# Patient Record
Sex: Female | Born: 1985
Health system: Southern US, Community
[De-identification: ages and names within clinical notes are randomized; demographics above are authoritative.]

## PROBLEM LIST (undated history)

## (undated) ENCOUNTER — Inpatient Hospital Stay (HOSPITAL_COMMUNITY): Payer: Self-pay

## (undated) DIAGNOSIS — N390 Urinary tract infection, site not specified: Secondary | ICD-10-CM

## (undated) DIAGNOSIS — O139 Gestational [pregnancy-induced] hypertension without significant proteinuria, unspecified trimester: Secondary | ICD-10-CM

## (undated) DIAGNOSIS — I499 Cardiac arrhythmia, unspecified: Secondary | ICD-10-CM

## (undated) DIAGNOSIS — F419 Anxiety disorder, unspecified: Secondary | ICD-10-CM

## (undated) DIAGNOSIS — F32A Depression, unspecified: Secondary | ICD-10-CM

## (undated) DIAGNOSIS — F329 Major depressive disorder, single episode, unspecified: Secondary | ICD-10-CM

## (undated) DIAGNOSIS — Z8619 Personal history of other infectious and parasitic diseases: Secondary | ICD-10-CM

## (undated) HISTORY — DX: Anxiety disorder, unspecified: F41.9

## (undated) HISTORY — PX: TONSILLECTOMY: SUR1361

## (undated) HISTORY — DX: Cardiac arrhythmia, unspecified: I49.9

## (undated) HISTORY — DX: Personal history of other infectious and parasitic diseases: Z86.19

## (undated) HISTORY — DX: Urinary tract infection, site not specified: N39.0

---
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
## 2004-12-10 ENCOUNTER — Other Ambulatory Visit: Admission: RE | Admit: 2004-12-10 | Discharge: 2004-12-10 | Payer: Self-pay | Admitting: Obstetrics and Gynecology

## 2011-01-09 ENCOUNTER — Encounter: Payer: Self-pay | Admitting: Family Medicine

## 2011-01-09 ENCOUNTER — Ambulatory Visit (INDEPENDENT_AMBULATORY_CARE_PROVIDER_SITE_OTHER): Payer: PRIVATE HEALTH INSURANCE | Admitting: Family Medicine

## 2011-01-09 DIAGNOSIS — L309 Dermatitis, unspecified: Secondary | ICD-10-CM

## 2011-01-09 DIAGNOSIS — F411 Generalized anxiety disorder: Secondary | ICD-10-CM

## 2011-01-09 DIAGNOSIS — F419 Anxiety disorder, unspecified: Secondary | ICD-10-CM | POA: Insufficient documentation

## 2011-01-09 DIAGNOSIS — L259 Unspecified contact dermatitis, unspecified cause: Secondary | ICD-10-CM

## 2011-01-09 DIAGNOSIS — Z8249 Family history of ischemic heart disease and other diseases of the circulatory system: Secondary | ICD-10-CM

## 2011-01-09 LAB — BASIC METABOLIC PANEL
BUN: 16 mg/dL (ref 6–23)
Chloride: 105 mEq/L (ref 96–112)
Creatinine, Ser: 0.8 mg/dL (ref 0.4–1.2)
GFR: 95.13 mL/min (ref >60.00)
Potassium: 4.7 mEq/L (ref 3.5–5.1)

## 2011-01-09 LAB — CBC WITH DIFFERENTIAL/PLATELET
Basophils Absolute: 0 10*3/uL (ref 0.0–0.1)
Eosinophils Absolute: 0.1 10*3/uL (ref 0.0–0.7)
Lymphocytes Relative: 29 % (ref 12.0–46.0)
MCHC: 34.2 g/dL (ref 30.0–36.0)
MCV: 88.2 fl (ref 78.0–100.0)
Monocytes Absolute: 0.6 10*3/uL (ref 0.1–1.0)
Neutro Abs: 4.7 10*3/uL (ref 1.4–7.7)
Neutrophils Relative %: 62 % (ref 43.0–77.0)
RDW: 13 % (ref 11.5–14.6)

## 2011-01-09 LAB — LIPID PANEL
Cholesterol: 188 mg/dL (ref 0–200)
HDL: 70.6 mg/dL (ref >39.00)
LDL Cholesterol: 97 mg/dL (ref 0–99)
VLDL: 20.6 mg/dL (ref 0.0–40.0)

## 2011-01-09 LAB — HEPATIC FUNCTION PANEL
ALT: 13 U/L (ref 0–35)
Bilirubin, Direct: 0 mg/dL (ref 0.0–0.3)
Total Bilirubin: 0.4 mg/dL (ref 0.3–1.2)

## 2011-01-09 LAB — TSH: TSH: 1.71 u[IU]/mL (ref 0.35–5.50)

## 2011-01-09 MED ORDER — ALPRAZOLAM 0.5 MG PO TABS: 0.5000 mg | ORAL_TABLET | Freq: Three times a day (TID) | ORAL | Status: AC | PRN

## 2011-01-09 MED ORDER — DESOXIMETASONE 0.25 % EX OINT: 1.0000 "application " | TOPICAL_OINTMENT | Freq: Two times a day (BID) | CUTANEOUS | Status: AC

## 2011-01-09 MED ORDER — BUSPIRONE HCL 15 MG PO TABS: 15.0000 mg | ORAL_TABLET | Freq: Two times a day (BID) | ORAL | Status: AC

## 2011-01-09 NOTE — Progress Notes (Signed)
  Subjective:    Patient ID: Lynn Bell, female    DOB: [birth year], 25 y.o.   MRN: [id number]  HPI Here today to establish care.  No previous PCP.  GYN- [facility])  Family hx of CAD- mom had sudden cardiac death last year in her 59s, dad had MI 1 month later.  Pt is obviously concerned about her risk for CAD/MI/CVA.  Reports brother recently had labs checked and his lipids were 'a mess'.  + smoker.  Exercising regularly.  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  Hands scabbed, peeled, became infected.  Was seeing Emily Filbert and Danella Deis derm, started on steroid cream.  Took a year off of work, sxs improved but did not resolve.  Switched dye line and ingredient is no longer present but hands again are breaking out.  Painful, itchy.  Pt confused b/c she always wears gloves, not coming in direct contact w/ chemicals.  Still using steroid ointment- clobetasol.  sxs only on hands and wrists.  Anxiety/Depression- has been on Wellbutrin, Lexapro.  Will have 'panic moments'- will take mom or dad's Alprazolam w/ good results but feels she would need to take this frequently.   Review of Systems For ROS see HPI     Objective:   Physical Exam  Constitutional: She is oriented to person, place, and time. She appears well-developed and well-nourished. No distress.  HENT:  Head: Normocephalic and atraumatic.  Eyes: Conjunctivae and EOM are normal. Pupils are equal, round, and reactive to light.  Neck: Normal range of motion. Neck supple. No thyromegaly present.  Cardiovascular: Normal rate, regular rhythm, normal heart sounds and intact distal pulses.   No murmur heard. Pulmonary/Chest: Effort normal and breath sounds normal. No respiratory distress. She has no wheezes.  Abdominal: Soft. Bowel sounds are normal. She exhibits no distension. There is no tenderness. There is no rebound.  Musculoskeletal: She exhibits no edema.  Lymphadenopathy:   She has no cervical adenopathy.  Neurological: She is alert and oriented to person, place, and time. No cranial nerve deficit. Coordination normal.  Skin: Skin is warm and dry.       Skin on hands and wrists cracked, scabbed, peeling, some ? plaque formation  Psychiatric:       anxious          Assessment & Plan:

## 2011-01-09 NOTE — Patient Instructions (Signed)
Please schedule your complete physical in 1 month (we'll also review your anxiety at this time) We'll notify you of your lab results Start the Buspar- 1/2 tab twice daily x2 weeks and then increase to 1 tab twice daily Use the Alprazolam only as needed for those severe panic moments Increase the steroid ointment to twice daily Quit smoking!  You can do it! Call with any questions or concerns Welcome!  We're glad to have you!

## 2011-01-13 LAB — VITAMIN D 1,25 DIHYDROXY: Vitamin D3 1, 25 (OH)2: 31 pg/mL

## 2011-01-14 ENCOUNTER — Encounter: Payer: Self-pay | Admitting: *Deleted

## 2011-01-19 NOTE — Assessment & Plan Note (Signed)
Stressed the importance of smoking cessation, regular exercise, and healthy diet.  Will check labs to risk stratify.  If normal lipid panel will do advanced lipid testing in 6-12 months.  Pt expressed understanding and is in agreement w/ plan.

## 2011-01-19 NOTE — Assessment & Plan Note (Signed)
Pt w/ moderate to severe sxs on hands.  Has derm but hasn't seen them recently.  Needs steroid cream refill.  Will switch to ointment for better penetration.  Reviewed trigger avoidance.  Will follow.

## 2011-01-19 NOTE — Assessment & Plan Note (Signed)
Rather than having pt become dependent on frequent benzo use will start buspar and have benzos on hand prn.  Reviewed supportive care and red flags that should prompt return.  Pt expressed understanding and is in agreement w/ plan.

## 2011-02-10 ENCOUNTER — Ambulatory Visit: Payer: PRIVATE HEALTH INSURANCE | Admitting: Family Medicine

## 2011-02-11 ENCOUNTER — Ambulatory Visit (INDEPENDENT_AMBULATORY_CARE_PROVIDER_SITE_OTHER): Payer: PRIVATE HEALTH INSURANCE | Admitting: Family Medicine

## 2011-02-11 DIAGNOSIS — F411 Generalized anxiety disorder: Secondary | ICD-10-CM

## 2011-02-11 DIAGNOSIS — F419 Anxiety disorder, unspecified: Secondary | ICD-10-CM

## 2011-02-11 NOTE — Patient Instructions (Addendum)
You look great! Call me if you think we need to go up on the Buspar Call with any questions or concerns Have a great summer!

## 2011-02-11 NOTE — Progress Notes (Signed)
  Subjective:    Patient ID: Lynn Bell, female    DOB: Jan 06, 1986, 25 y.o.   MRN: [id number]  HPI  Anxiety- started buspar ~10 days ago.  Feels sxs are better controlled.  Will use xanax prn- only had 2 panicked moments since last visit.  Denies side effects  Review of Systems For ROS see HPI     Objective:   Physical Exam  Constitutional: She appears well-developed and well-nourished. No distress.  Psychiatric: She has a normal mood and affect. Her behavior is normal. Thought content normal.          Assessment & Plan:

## 2011-02-15 NOTE — Assessment & Plan Note (Signed)
Pt feels sxs are better.  Pt to call if she feels she needs to adjust or change her dose.  Will follow.

## 2012-07-06 ENCOUNTER — Emergency Department (HOSPITAL_COMMUNITY)
Admission: EM | Admit: 2012-07-06 | Discharge: 2012-07-06 | Disposition: A | Discharge status: Home / Self Care | Payer: PRIVATE HEALTH INSURANCE | Attending: Emergency Medicine | Admitting: Emergency Medicine

## 2012-07-06 ENCOUNTER — Telehealth: Payer: Self-pay

## 2012-07-06 ENCOUNTER — Telehealth: Payer: Self-pay | Admitting: Family Medicine

## 2012-07-06 ENCOUNTER — Encounter (HOSPITAL_COMMUNITY): Payer: Self-pay | Admitting: Emergency Medicine

## 2012-07-06 DIAGNOSIS — Z87440 Personal history of urinary (tract) infections: Secondary | ICD-10-CM | POA: Insufficient documentation

## 2012-07-06 DIAGNOSIS — Z8679 Personal history of other diseases of the circulatory system: Secondary | ICD-10-CM | POA: Insufficient documentation

## 2012-07-06 DIAGNOSIS — Z79899 Other long term (current) drug therapy: Secondary | ICD-10-CM | POA: Insufficient documentation

## 2012-07-06 DIAGNOSIS — R42 Dizziness and giddiness: Secondary | ICD-10-CM | POA: Insufficient documentation

## 2012-07-06 DIAGNOSIS — R1110 Vomiting, unspecified: Secondary | ICD-10-CM | POA: Insufficient documentation

## 2012-07-06 DIAGNOSIS — Z8619 Personal history of other infectious and parasitic diseases: Secondary | ICD-10-CM | POA: Insufficient documentation

## 2012-07-06 DIAGNOSIS — F17200 Nicotine dependence, unspecified, uncomplicated: Secondary | ICD-10-CM | POA: Insufficient documentation

## 2012-07-06 LAB — BASIC METABOLIC PANEL
BUN: 14 mg/dL (ref 6–23)
Calcium: 9.6 mg/dL (ref 8.4–10.5)
GFR calc non Af Amer: >90 mL/min (ref >90)
Glucose, Bld: 104 mg/dL — ABNORMAL HIGH (ref 70–99)

## 2012-07-06 LAB — URINALYSIS, ROUTINE W REFLEX MICROSCOPIC
Bilirubin Urine: NEGATIVE
Protein, ur: NEGATIVE mg/dL
Urobilinogen, UA: 0.2 mg/dL (ref 0.0–1.0)

## 2012-07-06 LAB — CBC WITH DIFFERENTIAL/PLATELET
Basophils Relative: 0 % (ref 0–1)
Eosinophils Absolute: 0.1 10*3/uL (ref 0.0–0.7)
Hemoglobin: 13.9 g/dL (ref 12.0–15.0)
MCH: 30.3 pg (ref 26.0–34.0)
MCHC: 34.8 g/dL (ref 30.0–36.0)
Monocytes Relative: 3 % (ref 3–12)
Neutrophils Relative %: 86 % — ABNORMAL HIGH (ref 43–77)
Platelets: 311 10*3/uL (ref 150–400)

## 2012-07-06 LAB — URINE MICROSCOPIC-ADD ON

## 2012-07-06 LAB — PREGNANCY, URINE: Preg Test, Ur: NEGATIVE

## 2012-07-06 MED ORDER — MECLIZINE HCL 25 MG PO TABS: 25.0000 mg | ORAL_TABLET | Freq: Three times a day (TID) | ORAL | Status: DC | PRN

## 2012-07-06 MED ORDER — SODIUM CHLORIDE 0.9 % IV BOLUS (SEPSIS)
1000.0000 mL | Freq: Once | INTRAVENOUS | Status: AC
  Administered 2012-07-06: 1000 mL via INTRAVENOUS

## 2012-07-06 MED ORDER — ONDANSETRON HCL 4 MG/2ML IJ SOLN
4.0000 mg | Freq: Once | INTRAMUSCULAR | Status: AC
  Administered 2012-07-06: 4 mg via INTRAVENOUS
  Filled 2012-07-06: qty 2

## 2012-07-06 MED ORDER — MECLIZINE HCL 25 MG PO TABS
25.0000 mg | ORAL_TABLET | Freq: Once | ORAL | Status: AC
  Administered 2012-07-06: 25 mg via ORAL
  Filled 2012-07-06: qty 1

## 2012-07-06 MED ORDER — KETOROLAC TROMETHAMINE 30 MG/ML IJ SOLN
INTRAMUSCULAR | Status: AC
  Administered 2012-07-06: 30 mg via INTRAVENOUS
  Filled 2012-07-06: qty 1

## 2012-07-06 NOTE — Telephone Encounter (Signed)
Caller nurse states mom of pt states pt vomiting with severe HA, dizzy, vertigo, needs to be seen immediately. I talked to PCP Tabori and she is behind and booked up this AM. Advised caller nurse of this and she stated she will send pt to ER.    MW

## 2012-07-06 NOTE — ED Provider Notes (Signed)
Medical screening examination/treatment/procedure(s) were performed by non-physician practitioner and as supervising physician I was immediately available for consultation/collaboration.   Carleene Cooper III, MD 07/06/12 Ernestina [place]

## 2012-07-06 NOTE — Telephone Encounter (Signed)
Caller: Julia/Mother; Patient Name: Lynn Bell; PCP: Sheliah Hatch.; Best Callback Phone Number: [phone]; Reason for call: Other Sudden onset of vomiiting since 5:30 am today 10/23 with headache, lying on bathroomm floor and has been vomiting profusely.  Complains of dizziness, vertigo.  Emesis yellow clear.  Has given fluids, cracker but all has come back up.  Pain Right ear into Right shoulder but can put chin to chest.  Afebrile.  LMP started 3-4 days ago.  Triaged in Vomiting, Headache Guideline - Disposition:  ER Immediately due to New Onset of severe dizziness and vertigo.  Spoke with Hilda Lias in office - unable to work patient into schedule this morning, send to [facility] ER.  Gave care information for transport.

## 2012-07-06 NOTE — ED Notes (Signed)
Pt c/o dizziness and vomiting starting this am upon waking; pt sts feels like room is spinning and having some generalized abd pain

## 2012-07-06 NOTE — Telephone Encounter (Signed)
Pt currently in [facility] 

## 2012-07-06 NOTE — ED Provider Notes (Signed)
History     CSN: [id number]  Arrival date & time 07/06/12  4010   First MD Initiated Contact with Patient 07/06/12 1037      Chief Complaint  Patient presents with  . Dizziness  . Emesis    (Consider location/radiation/quality/duration/timing/severity/associated sxs/prior treatment) HPI Patient presents emergency department with dizziness, and nausea with vomiting, that began when she awoke this morning.  Patient, states, that she awoke with room spinning type dizziness.  Patient, states, that she did not have chest pain, shortness of breath, abdominal pain, numbness, weakness, visual changes, syncope, or fever.  Patient said she did not take anything prior to arrival, for her symptoms.  Patient, states, that movement, and made her symptoms worse.The patient states that she has been feeling better at arrival here. Past Medical History  Diagnosis Date  . History of chicken pox   . UTI (lower urinary tract infection)   . Cardiac arrhythmia     History reviewed. No pertinent past surgical history.  Family History  Problem Relation Age of Onset  . Breast cancer Maternal Grandmother     possibly paternal also  . Hyperlipidemia Father   . Hyperlipidemia Mother   . Heart disease Father   . Heart disease Mother   . Hypertension Father   . Hypertension Mother   . Kidney disease Paternal Grandfather   . Cancer Maternal Grandfather     kidney removal due to mass    History  Substance Use Topics  . Smoking status: Current Every Day Smoker  . Smokeless tobacco: Not on file   Comment: 1 pack per week  . Alcohol Use: Yes     1 glass of wine per night    OB History    Grav Para Term Preterm Abortions TAB SAB Ect Mult Living                  Review of Systems All other systems negative except as documented in the HPI. All pertinent positives and negatives as reviewed in the HPI.  Allergies  Review of patient's allergies indicates no known allergies.  Home Medications    Current Outpatient Rx  Name Route Sig Dispense Refill  . ALPRAZOLAM 0.25 MG PO TABS Oral Take 0.25 mg by mouth 3 (three) times daily as needed. For anxiety    . ZZZQUIL PO Oral Take 1 tablet by mouth at bedtime as needed. For sleep    . ADULT MULTIVITAMIN W/MINERALS CH Oral Take 1 tablet by mouth daily.      BP 120/77  Pulse 98  Temp 98.3 F (36.8 C) (Oral)  Resp 18  SpO2 100%  Physical Exam  Nursing note and vitals reviewed. Constitutional: She is oriented to person, place, and time. She appears well-developed and well-nourished.  HENT:  Head: Normocephalic and atraumatic.  Mouth/Throat: Oropharynx is clear and moist. No oropharyngeal exudate.  Cardiovascular: Normal rate, regular rhythm and normal heart sounds.  Exam reveals no gallop and no friction rub.   No murmur heard. Pulmonary/Chest: Effort normal and breath sounds normal. No respiratory distress. She has no wheezes. She has no rales.  Neurological: She is alert and oriented to person, place, and time. She has normal strength. No cranial nerve deficit or sensory deficit. She exhibits normal muscle tone. Coordination and gait normal. GCS eye subscore is 4. GCS verbal subscore is 5. GCS motor subscore is 6.    ED Course  Procedures (including critical care time)  Labs Reviewed  URINALYSIS, ROUTINE W REFLEX  MICROSCOPIC - Abnormal; Notable for the following:    Hgb urine dipstick TRACE (*)     All other components within normal limits  BASIC METABOLIC PANEL - Abnormal; Notable for the following:    Glucose, Bld 104 (*)     All other components within normal limits  CBC WITH DIFFERENTIAL - Abnormal; Notable for the following:    WBC 15.6 (*)     Neutrophils Relative 86 (*)     Neutro Abs 13.5 (*)     Lymphocytes Relative 10 (*)     All other components within normal limits  POCT PREGNANCY, URINE  URINE MICROSCOPIC-ADD ON  PREGNANCY, URINE    The patient is feeling better at this time. The patient has received  fluids here as well as meclizine. The patient will be advised to return here as needed. The patient is asked to follow up with her PCP. The patient most likely has viral inner ear infection. The patient will be asked to increase her fluid intake.  MDM  MDM Reviewed: vitals and nursing note Interpretation: labs            Carlyle Dolly, PA-C 07/06/12 1336

## 2012-07-06 NOTE — Telephone Encounter (Signed)
Agree w/ ER eval for likely fluids

## 2013-01-12 LAB — HM PAP SMEAR: HM Pap smear: NORMAL

## 2013-06-08 ENCOUNTER — Encounter: Payer: Self-pay | Admitting: Family Medicine

## 2013-06-08 ENCOUNTER — Ambulatory Visit (INDEPENDENT_AMBULATORY_CARE_PROVIDER_SITE_OTHER): Payer: PRIVATE HEALTH INSURANCE | Admitting: Family Medicine

## 2013-06-08 VITALS — BP 118/76 | HR 85 | Temp 98.6°F | Wt 158.2 lb

## 2013-06-08 DIAGNOSIS — F419 Anxiety disorder, unspecified: Secondary | ICD-10-CM

## 2013-06-08 DIAGNOSIS — L259 Unspecified contact dermatitis, unspecified cause: Secondary | ICD-10-CM

## 2013-06-08 DIAGNOSIS — F411 Generalized anxiety disorder: Secondary | ICD-10-CM

## 2013-06-08 DIAGNOSIS — L309 Dermatitis, unspecified: Secondary | ICD-10-CM

## 2013-06-08 MED ORDER — ALPRAZOLAM 0.25 MG PO TABS: 0.2500 mg | ORAL_TABLET | Freq: Three times a day (TID) | ORAL | Status: DC | PRN

## 2013-06-08 MED ORDER — HALOBETASOL PROPIONATE 0.05 % EX OINT: TOPICAL_OINTMENT | Freq: Two times a day (BID) | CUTANEOUS | Status: DC

## 2013-06-08 NOTE — Progress Notes (Signed)
  Subjective:    Patient ID: Lynn Bell, female    DOB: 08-26-1986, 27 y.o.   MRN: [id number]  HPI Eczema- pt saw derm previously.  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  sxs were really bad this summer.  Ran out of steroid ointment.  Started using coconut oil w/ some relief.  Pt is concerned that there is something else at play- anxiety, hormones.  Anxiety- pt feels sxs were well controlled x18 months.  Restarted birth control 2 months ago and feels that sxs have returned.  Has been out of Alprazolam.  Not having daily sxs.  Hesitant to start SSRI.   Review of Systems For ROS see HPI     Objective:   Physical Exam  Vitals reviewed. Constitutional: She is oriented to person, place, and time. She appears well-developed and well-nourished. No distress.  HENT:  Head: Normocephalic and atraumatic.  Cardiovascular: Normal rate, regular rhythm, normal heart sounds and intact distal pulses.   Pulmonary/Chest: Effort normal and breath sounds normal. No respiratory distress. She has no wheezes. She has no rales.  Musculoskeletal: She exhibits no edema and no tenderness.  Neurological: She is alert and oriented to person, place, and time.  Skin: Skin is warm and dry.  Diffuse eczematous patches on hands bilaterally  Psychiatric: She has a normal mood and affect. Her behavior is normal.          Assessment & Plan:

## 2013-06-08 NOTE — Assessment & Plan Note (Signed)
Deteriorated since resuming birth control (NuvaRing).  Suspect this is a hormonal adjustment.  Restart Xanax prn.  Controlled substance agreement signed.  If sxs don't improve or she is using meds regularly, will need daily controller SSRI.  Pt expressed understanding and is in agreement w/ plan.

## 2013-06-08 NOTE — Patient Instructions (Addendum)
Follow up in 2 months to recheck mood Use the steroid ointment as needed Continue the Alprazolam as needed Call with any questions or concerns Hang in there!!!

## 2013-06-08 NOTE — Assessment & Plan Note (Signed)
Persistent.  Steroid ointment given.  Reviewed supportive care and red flags that should prompt return.  Pt expressed understanding and is in agreement w/ plan.

## 2013-07-24 ENCOUNTER — Encounter: Payer: Self-pay | Admitting: Family Medicine

## 2013-08-08 ENCOUNTER — Encounter: Payer: Self-pay | Admitting: Family Medicine

## 2013-08-08 ENCOUNTER — Ambulatory Visit (INDEPENDENT_AMBULATORY_CARE_PROVIDER_SITE_OTHER): Payer: PRIVATE HEALTH INSURANCE | Admitting: Family Medicine

## 2013-08-08 VITALS — BP 118/80 | HR 93 | Temp 98.1°F | Resp 16 | Wt 156.1 lb

## 2013-08-08 DIAGNOSIS — F411 Generalized anxiety disorder: Secondary | ICD-10-CM

## 2013-08-08 DIAGNOSIS — F419 Anxiety disorder, unspecified: Secondary | ICD-10-CM

## 2013-08-08 NOTE — Progress Notes (Signed)
  Subjective:    Patient ID: Lynn Bell, female    DOB: 1985/10/01, 27 y.o.   MRN: [id number]  HPI Pre visit review using our clinic review tool, if applicable. No additional management support is needed unless otherwise documented below in the visit note.  Anxiety- chronic problem, using Alprazolam as needed.  'much better'.  Pt has gone Gluten and dairy free and both skin and anxiety have improved.  Also stopped NuvaRing, 'the hormones were making crazy'.   Review of Systems For ROS see HPI     Objective:   Physical Exam  Vitals reviewed. Constitutional: She is oriented to person, place, and time. She appears well-developed and well-nourished. No distress.  Cardiovascular: Normal rate, regular rhythm and normal heart sounds.   Neurological: She is alert and oriented to person, place, and time.  Skin: Skin is warm and dry.  Psychiatric: She has a normal mood and affect. Her behavior is normal. Thought content normal.          Assessment & Plan:

## 2013-08-08 NOTE — Patient Instructions (Signed)
Schedule your complete physical in 6 months Keep up the good work!  You look great! Discuss your birth control options w/ him- low dose combo pills (estrogen and progestin), progestin only pills, or IUD (no hormones) Call with any questions or concerns Happy Holidays!!!

## 2013-08-08 NOTE — Assessment & Plan Note (Signed)
Improved since changing diet and stopping hormones.  Rarely requiring xanax.  Will follow.

## 2014-01-29 ENCOUNTER — Encounter: Payer: PRIVATE HEALTH INSURANCE | Admitting: Family Medicine

## 2014-02-27 ENCOUNTER — Encounter: Payer: PRIVATE HEALTH INSURANCE | Admitting: Family Medicine

## 2014-05-10 ENCOUNTER — Telehealth: Payer: Self-pay | Admitting: Family Medicine

## 2014-05-10 NOTE — Telephone Encounter (Signed)
Patient Information:  Caller Name: Meryle  Phone: [phone]  Patient: Lynn Bell, Lynn Bell  Gender: Female  DOB: Oct 22, 1985  Age: 28 Years  PCP: Midge Minium  Pregnant: No  Office Follow Up:  Does the office need to follow up with this patient?: No  Instructions For The Office: N/A  RN Note:  Pt refused appt today due to her schedule but requested appt for 05/11/14.  Symptoms  Reason For Call & Symptoms: Pt reports she has blood in the urine with low back pain. frequency.  Reviewed Health History In EMR: Yes  Reviewed Medications In EMR: Yes  Reviewed Allergies In EMR: Yes  Reviewed Surgeries / Procedures: Yes  Date of Onset of Symptoms: 05/07/2014  Treatments Tried: AZO standard  Treatments Tried Worked: No OB / GYN:  LMP: 04/28/2014  Guideline(s) Used:  Urination Pain - Female  Disposition Per Guideline:   Go to Office Now  Reason For Disposition Reached:   Side (flank) or lower back pain present  Advice Given:  Call Back If:  You become worse.  Patient Will Follow Care Advice:  YES  Appointment Scheduled:  05/11/2014 11:15:00 Appointment Scheduled Provider:  Midge Minium.

## 2014-05-10 NOTE — Telephone Encounter (Signed)
FYI, pt on schedule tomorrow.

## 2014-05-11 ENCOUNTER — Encounter: Payer: Self-pay | Admitting: Family Medicine

## 2014-05-11 ENCOUNTER — Ambulatory Visit (INDEPENDENT_AMBULATORY_CARE_PROVIDER_SITE_OTHER): Payer: PRIVATE HEALTH INSURANCE | Admitting: Family Medicine

## 2014-05-11 VITALS — BP 118/74 | HR 77 | Temp 98.2°F | Resp 16 | Wt 162.1 lb

## 2014-05-11 DIAGNOSIS — R350 Frequency of micturition: Secondary | ICD-10-CM

## 2014-05-11 DIAGNOSIS — N3000 Acute cystitis without hematuria: Secondary | ICD-10-CM

## 2014-05-11 DIAGNOSIS — N3001 Acute cystitis with hematuria: Secondary | ICD-10-CM

## 2014-05-11 DIAGNOSIS — R319 Hematuria, unspecified: Secondary | ICD-10-CM

## 2014-05-11 LAB — POCT URINALYSIS DIPSTICK
Bilirubin, UA: NEGATIVE
Glucose, UA: NEGATIVE
KETONES UA: NEGATIVE
Leukocytes, UA: NEGATIVE
Nitrite, UA: NEGATIVE
PH UA: 7.5
PROTEIN UA: NEGATIVE
UROBILINOGEN UA: 0.2

## 2014-05-11 MED ORDER — CEPHALEXIN 500 MG PO CAPS: 500.0000 mg | ORAL_CAPSULE | Freq: Two times a day (BID) | ORAL | Status: AC

## 2014-05-11 NOTE — Progress Notes (Signed)
Pre visit review using our clinic review tool, if applicable. No additional management support is needed unless otherwise documented below in the visit note. 

## 2014-05-11 NOTE — Progress Notes (Signed)
   Subjective:    Patient ID: Lynn Bell, female    DOB: 02/18/1986, 28 y.o.   MRN: [id number]  HPI UTI- sxs started 10 days ago w/ blood on toilet paper.  Took AZO and cranberry pills and sxs improved.  Did not take meds x2 days and again noted blood yesterday.  Increased frequency and hesitancy.  Denies dysuria.  No fevers.  + back pain.  + suprapubic pressure.   Review of Systems For ROS see HPI     Objective:   Physical Exam  Vitals reviewed. Constitutional: She appears well-developed and well-nourished. No distress.  Abdominal: Soft. She exhibits no distension. There is no tenderness (no suprapubic or CVA tenderness).          Assessment & Plan:

## 2014-05-11 NOTE — Patient Instructions (Signed)
Start the Keflex twice daily for UTI Drink plenty of fluids If the culture comes back negative, we'll do additional investigating as to the cause of the blood Call with any questions or concerns Happy Belated Birthday!!!

## 2014-05-13 DIAGNOSIS — N390 Urinary tract infection, site not specified: Secondary | ICD-10-CM | POA: Insufficient documentation

## 2014-05-13 NOTE — Assessment & Plan Note (Signed)
New.  Pt's sxs and UA consistent w/ infxn.  Send urine for cx.  Start abx.  Adjust tx if needed pending sensitivities.

## 2014-05-15 NOTE — Addendum Note (Signed)
Addended by: Modena Morrow D on: 05/15/2014 02:19 PM   Modules accepted: Orders

## 2014-05-18 LAB — URINE CULTURE: Colony Count: >=100000

## 2015-04-02 ENCOUNTER — Telehealth: Payer: Self-pay | Admitting: Family Medicine

## 2015-04-02 MED ORDER — ALPRAZOLAM 0.25 MG PO TABS: 0.2500 mg | ORAL_TABLET | Freq: Three times a day (TID) | ORAL | Status: DC | PRN

## 2015-04-02 NOTE — Telephone Encounter (Signed)
Med filled and faxed.  

## 2015-04-02 NOTE — Telephone Encounter (Signed)
Last OV 05/11/14 Alprazolam last filled 06/08/13 #30 with 1

## 2015-04-02 NOTE — Telephone Encounter (Signed)
Caller name: Haily, Caley Relation to pt: self  Call back number: [phone] Pharmacy: [facility] - [place], [address] AT [facility] [phone] (Phone) [phone] (Fax)        Reason for call:  Patient requesting a refill ALPRAZolam (XANAX) 0.25 MG tablet. Patient scheduled physical appointment for 08/30/2015

## 2015-04-02 NOTE — Telephone Encounter (Signed)
Ok for #30, no refills 

## 2015-08-30 ENCOUNTER — Encounter: Payer: Self-pay | Admitting: Family Medicine

## 2015-08-30 ENCOUNTER — Ambulatory Visit (INDEPENDENT_AMBULATORY_CARE_PROVIDER_SITE_OTHER): Payer: 59 | Admitting: Family Medicine

## 2015-08-30 VITALS — BP 122/82 | HR 89 | Temp 98.9°F | Resp 16 | Ht 64.0 in | Wt 163.2 lb

## 2015-08-30 DIAGNOSIS — F419 Anxiety disorder, unspecified: Secondary | ICD-10-CM | POA: Diagnosis not present

## 2015-08-30 DIAGNOSIS — Z0000 Encounter for general adult medical examination without abnormal findings: Secondary | ICD-10-CM | POA: Diagnosis not present

## 2015-08-30 DIAGNOSIS — D229 Melanocytic nevi, unspecified: Secondary | ICD-10-CM | POA: Diagnosis not present

## 2015-08-30 LAB — BASIC METABOLIC PANEL
BUN: 17 mg/dL (ref 7–25)
CO2: 29 mmol/L (ref 20–31)
Calcium: 9.5 mg/dL (ref 8.6–10.2)
Chloride: 103 mmol/L (ref 98–110)
Creat: 0.78 mg/dL (ref 0.50–1.10)
Glucose, Bld: 114 mg/dL — ABNORMAL HIGH (ref 65–99)
Potassium: 4.1 mmol/L (ref 3.5–5.3)
Sodium: 139 mmol/L (ref 135–146)

## 2015-08-30 LAB — CBC WITH DIFFERENTIAL/PLATELET
Basophils Absolute: 0 10*3/uL (ref 0.0–0.1)
Basophils Relative: 0 % (ref 0–1)
Eosinophils Absolute: 0.2 10*3/uL (ref 0.0–0.7)
Eosinophils Relative: 3 % (ref 0–5)
HCT: 37.6 % (ref 36.0–46.0)
Hemoglobin: 12.8 g/dL (ref 12.0–15.0)
Lymphocytes Relative: 31 % (ref 12–46)
Lymphs Abs: 2.2 10*3/uL (ref 0.7–4.0)
MCH: 30.1 pg (ref 26.0–34.0)
MCHC: 34 g/dL (ref 30.0–36.0)
MCV: 88.5 fL (ref 78.0–100.0)
MPV: 9 fL (ref 8.6–12.4)
Monocytes Absolute: 0.5 10*3/uL (ref 0.1–1.0)
Monocytes Relative: 7 % (ref 3–12)
Neutro Abs: 4.2 10*3/uL (ref 1.7–7.7)
Neutrophils Relative %: 59 % (ref 43–77)
Platelets: 268 10*3/uL (ref 150–400)
RBC: 4.25 MIL/uL (ref 3.87–5.11)
RDW: 13.5 % (ref 11.5–15.5)
WBC: 7.1 10*3/uL (ref 4.0–10.5)

## 2015-08-30 LAB — HEPATIC FUNCTION PANEL
ALK PHOS: 40 U/L (ref 33–115)
ALT: 11 U/L (ref 6–29)
AST: 15 U/L (ref 10–30)
Albumin: 4.5 g/dL (ref 3.6–5.1)
BILIRUBIN INDIRECT: 0.4 mg/dL (ref 0.2–1.2)
Bilirubin, Direct: 0.1 mg/dL (ref <=0.2)
TOTAL PROTEIN: 6.9 g/dL (ref 6.1–8.1)
Total Bilirubin: 0.5 mg/dL (ref 0.2–1.2)

## 2015-08-30 LAB — LIPID PANEL
Cholesterol: 181 mg/dL (ref 125–200)
HDL: 92 mg/dL (ref >=46)
LDL Cholesterol: 74 mg/dL (ref <130)
Total CHOL/HDL Ratio: 2 Ratio (ref <=5.0)
Triglycerides: 77 mg/dL (ref <150)
VLDL: 15 mg/dL (ref <30)

## 2015-08-30 MED ORDER — FLUOXETINE HCL 10 MG PO TABS: 10.0000 mg | ORAL_TABLET | Freq: Every day | ORAL | Status: DC

## 2015-08-30 NOTE — Progress Notes (Signed)
   Subjective:    Patient ID: Lynn Bell, female    DOB: 10-Oct-1985, 29 y.o.   MRN: [id number]  HPI CPE- UTD on GYN.  Declines Flu.     Review of Systems Patient reports no vision/ hearing changes, adenopathy,fever, weight change,  persistant/recurrent hoarseness , swallowing issues, chest pain, palpitations, edema, persistant/recurrent cough, hemoptysis, dyspnea (rest/exertional/paroxysmal nocturnal), gastrointestinal bleeding (melena, rectal bleeding), abdominal pain, significant heartburn, bowel changes, GU symptoms (dysuria, hematuria, incontinence), Gyn symptoms (abnormal  bleeding, pain),  syncope, focal weakness, memory loss, numbness & tingling, skin/hair/nail changes, abnormal bruising or bleeding.  Increased anxiety- pt finds that she is using more alprazolam than previously.  Still not requiring daily but more than she was.  Pt reports she feels there is 'so much pressure' at work.  Recent engagement.  Pt would like a mole check at derm     Objective:   Physical Exam General Appearance:    Alert, cooperative, no distress, appears stated age  Head:    Normocephalic, without obvious abnormality, atraumatic  Eyes:    PERRL, conjunctiva/corneas clear, EOM's intact, fundi    benign, both eyes  Ears:    Normal TM's and external ear canals, both ears  Nose:   Nares normal, septum midline, mucosa normal, no drainage    or sinus tenderness  Throat:   Lips, mucosa, and tongue normal; teeth and gums normal  Neck:   Supple, symmetrical, trachea midline, no adenopathy;    Thyroid: no enlargement/tenderness/nodules  Back:     Symmetric, no curvature, ROM normal, no CVA tenderness  Lungs:     Clear to auscultation bilaterally, respirations unlabored  Chest Wall:    No tenderness or deformity   Heart:    Regular rate and rhythm, S1 and S2 normal, no murmur, rub   or gallop  Breast Exam:    Deferred to GYN  Abdomen:     Soft, non-tender, bowel sounds active all four quadrants,    no  masses, no organomegaly  Genitalia:    Deferred to GYN  Rectal:    Extremities:   Extremities normal, atraumatic, no cyanosis or edema  Pulses:   2+ and symmetric all extremities  Skin:   Skin color, texture, turgor normal, no rashes or lesions  Lymph nodes:   Cervical, supraclavicular, and axillary nodes normal  Neurologic:   CNII-XII intact, normal strength, sensation and reflexes    throughout           Assessment & Plan:

## 2015-08-30 NOTE — Assessment & Plan Note (Signed)
Pt's PE WNL w/ exception of being overweight.  Check labs.  Stressed need for healthy diet, regular exercise, and smoking cessation.  Will refer to dermatology for a skin check.  Anticipatory guidance provided.

## 2015-08-30 NOTE — Patient Instructions (Signed)
Follow up in 4-6 weeks to recheck anxiety We'll notify you of your lab results and make any changes if needed Start a daily prenatal vitamin We'll call you with your Derm appt for a mole check Continue to work on healthy diet and regular exercise- you've got this! Start the Prozac once daily to improve the anxiety Call with any questions or concerns If you want to join [place] at the new [place] office, any scheduled appointments will automatically transfer and we will see you at [address] [place] [address] [place], [place] [address] (Opening 09/17/15) Happy Holidays!!!

## 2015-08-30 NOTE — Progress Notes (Signed)
Pre visit review using our clinic review tool, if applicable. No additional management support is needed unless otherwise documented below in the visit note. 

## 2015-08-30 NOTE — Assessment & Plan Note (Signed)
Deteriorated.  Pt is finding that she is requiring Alprazolam more frequently and a higher dose.  Based on this, and the fact that she is considering pregnancy in the not too distant future, we will start low dose SSRI and monitor for improvement.  Pt expressed understanding and is in agreement w/ plan.

## 2015-08-31 LAB — VITAMIN D 25 HYDROXY (VIT D DEFICIENCY, FRACTURES): Vit D, 25-Hydroxy: 29 ng/mL — ABNORMAL LOW (ref 30–100)

## 2015-08-31 LAB — TSH: TSH: 1.57 u[IU]/mL (ref 0.350–4.500)

## 2015-09-02 ENCOUNTER — Encounter: Payer: Self-pay | Admitting: General Practice

## 2015-09-11 ENCOUNTER — Encounter: Payer: Self-pay | Admitting: Family Medicine

## 2015-09-30 ENCOUNTER — Ambulatory Visit: Payer: 59 | Admitting: Family Medicine

## 2015-10-07 ENCOUNTER — Ambulatory Visit (INDEPENDENT_AMBULATORY_CARE_PROVIDER_SITE_OTHER): Payer: BLUE CROSS/BLUE SHIELD | Admitting: Family Medicine

## 2015-10-07 VITALS — BP 136/81 | HR 77 | Temp 98.5°F | Ht 64.0 in | Wt 161.0 lb

## 2015-10-07 DIAGNOSIS — F419 Anxiety disorder, unspecified: Secondary | ICD-10-CM | POA: Diagnosis not present

## 2015-10-07 NOTE — Progress Notes (Signed)
   Subjective:    Patient ID: Lynn Bell, female    DOB: Jan 14, 1986, 30 y.o.   MRN: [id number]  HPI Anxiety- pt reports sxs are much better since starting Prozac.  Has only required 2-3 doses of Alprazolam since starting.  However, pt is having sexual side effects- inability to orgasm.  Pt was previously on Wellbutrin and this did not go well.   Review of Systems For ROS see HPI     Objective:   Physical Exam  Constitutional: She is oriented to person, place, and time. She appears well-developed and well-nourished. No distress.  HENT:  Head: Normocephalic and atraumatic.  Neurological: She is alert and oriented to person, place, and time.  Skin: Skin is warm and dry.  Psychiatric: She has a normal mood and affect. Her behavior is normal. Thought content normal.  Vitals reviewed.         Assessment & Plan:

## 2015-10-07 NOTE — Patient Instructions (Signed)
Follow up by phone or MyChart in 3-4 weeks to let me know how things are going We can certainly make changes if needed I am SO glad that you are feeling better! Call with any questions or concerns Happy New Year!!!

## 2015-10-07 NOTE — Assessment & Plan Note (Signed)
Much improved since starting Prozac.  Pt may be having anorgasmia due to the medication but admits that she has had a lot of stress recently.  Will give medication another 3-4 weeks and if she is still having difficulty, will try and switch to Trintellix.  Pt expressed understanding and is in agreement w/ plan.

## 2015-10-07 NOTE — Progress Notes (Signed)
Pre visit review using our clinic review tool, if applicable. No additional management support is needed unless otherwise documented below in the visit note. 

## 2015-11-25 ENCOUNTER — Telehealth: Payer: Self-pay | Admitting: Family Medicine

## 2015-11-25 MED ORDER — VORTIOXETINE HBR 10 MG PO TABS: 1.0000 | ORAL_TABLET | ORAL | Status: DC

## 2015-11-25 NOTE — Telephone Encounter (Signed)
Rx sent to pharmacy and changed per pcp.

## 2015-11-25 NOTE — Telephone Encounter (Signed)
I would consider switching her to Trintellix (which does not have similar sexual side effects) or possibly switching to Wellbutrin

## 2015-11-25 NOTE — Telephone Encounter (Signed)
Spoke with patient, she advised that she loves the prozac it is working very well however, she is still having the lack of libido. At her last OV you made a mention to her that there are alternative medications. She would like to know what you are thinking. Her Rx of prozac will end next week.

## 2015-11-25 NOTE — Telephone Encounter (Signed)
Relation to [id number] Call back [phone]   Reason for call:  Patient requesting a refill FLUoxetine (PROZAC) 10 MG tablet and would like to discuss medication. Please advise

## 2015-12-02 ENCOUNTER — Telehealth: Payer: Self-pay | Admitting: Family Medicine

## 2015-12-02 NOTE — Telephone Encounter (Signed)
Caller name: Shanon Brow with [facility] Can be reached: [phone] Pharmacy: [id number] - [place], [address] AT [facility] [address]  Reason for call: PA request for Trintellix has been faxed 3x to [phone] and today 1x [phone]. Pt has 2 days of med on hand per pharmacy. Shanon Brow states he has put it in Cover My Meds. He is calling to verify that we received request today.

## 2015-12-02 NOTE — Telephone Encounter (Signed)
Have you received this?

## 2015-12-04 NOTE — Telephone Encounter (Signed)
Received message to follow up with mom.   Called back and spoke with mom. She says that she still hasn't received a call back to discuss PA further. Mom Gregary Signs) says that her concern is that pt has been out of her medication for a few days now.   Please FU.   Thanks.

## 2015-12-04 NOTE — Telephone Encounter (Signed)
Pt's mom called in to check the status of pt's PA. Pt is currently out of medication. Explained PA process to mom.  Mom is requesting a call back to discuss further.   Kallie Locks  CB: [phone]

## 2015-12-04 NOTE — Telephone Encounter (Signed)
PA has been received but cannot give date of determination, as again process at this given time can take up to 30 days, will call Mom when I have more information on Friday [return to office]/SLS 03/22

## 2015-12-05 MED ORDER — BUPROPION HCL ER (XL) 150 MG PO TB24: 150.0000 mg | ORAL_TABLET | Freq: Every day | ORAL | Status: DC

## 2015-12-05 NOTE — Telephone Encounter (Signed)
They are not requesting a call back at this time just wanted provider to know to call mother

## 2015-12-05 NOTE — Telephone Encounter (Signed)
Medication filled to pharmacy as requested. Called pt and left a detailed message to inform.

## 2015-12-05 NOTE — Telephone Encounter (Signed)
PA form requested. Awaiting fax.

## 2015-12-05 NOTE — Telephone Encounter (Signed)
PA form completed and faxed back.  Awaiting response.  Spoke with mom to give her an update.  Mom says that patient has been without medication x 1 week.  Message routed to PCP to see if she would like to switch her to Wellbutrin instead.  Please advise.

## 2015-12-05 NOTE — Telephone Encounter (Signed)
Talked to mom Vanita Panda she is going to notify patient the rx is at pharmacy states patient cannot answer her phone during the day she calls for her. [phone]

## 2015-12-05 NOTE — Telephone Encounter (Signed)
Ok to start Wellbutrin XL 150mg  daily while waiting on PA.  If for whatever reason she has side effects w/ new medication she is to let us know

## 2015-12-06 ENCOUNTER — Telehealth: Payer: Self-pay | Admitting: *Deleted

## 2015-12-06 NOTE — Telephone Encounter (Signed)
Received request for PA on Trintellix, Initiated via Cover My Meds, received Approval; forwarded to pharmacy/SLS 03/24

## 2015-12-11 NOTE — Telephone Encounter (Signed)
Receied Denial letter from [employer]; pt must have tried & failed on at least two of the alternative formulary medications, they are as follows: sertraline, mirtazapine, citalopram, duloxetine, fluvoxamine, venlafaxine capsules, bupropion, and fluoxetine; pt has only tried fluoxetine/SLS 03/29

## 2016-01-06 ENCOUNTER — Telehealth: Payer: Self-pay | Admitting: General Practice

## 2016-01-06 NOTE — Telephone Encounter (Signed)
Pt called in wanting PCP to know that due to insurance reasons she is no longer on her trintellix. She is back to taking her wellbutrin 150mg  and wanted to inform that it is working great. Chart updated to remove the trintellix rx.

## 2016-03-30 ENCOUNTER — Encounter: Payer: Self-pay | Admitting: Family Medicine

## 2016-03-30 ENCOUNTER — Ambulatory Visit (INDEPENDENT_AMBULATORY_CARE_PROVIDER_SITE_OTHER): Payer: BLUE CROSS/BLUE SHIELD | Admitting: Family Medicine

## 2016-03-30 VITALS — BP 122/80 | HR 84 | Temp 98.1°F | Resp 16 | Ht 64.0 in | Wt 154.5 lb

## 2016-03-30 DIAGNOSIS — F419 Anxiety disorder, unspecified: Secondary | ICD-10-CM

## 2016-03-30 DIAGNOSIS — Z3009 Encounter for other general counseling and advice on contraception: Secondary | ICD-10-CM | POA: Insufficient documentation

## 2016-03-30 DIAGNOSIS — L409 Psoriasis, unspecified: Secondary | ICD-10-CM

## 2016-03-30 DIAGNOSIS — Z309 Encounter for contraceptive management, unspecified: Secondary | ICD-10-CM

## 2016-03-30 MED ORDER — NORETHINDRONE ACET-ETHINYL EST 1-20 MG-MCG PO TABS: 1.0000 | ORAL_TABLET | Freq: Every day | ORAL | Status: DC

## 2016-03-30 MED ORDER — HALOBETASOL PROPIONATE 0.05 % EX OINT: TOPICAL_OINTMENT | Freq: Two times a day (BID) | CUTANEOUS | Status: DC

## 2016-03-30 MED ORDER — BUPROPION HCL ER (XL) 300 MG PO TB24: 300.0000 mg | ORAL_TABLET | Freq: Every day | ORAL | Status: DC

## 2016-03-30 NOTE — Progress Notes (Signed)
Pre visit review using our clinic review tool, if applicable. No additional management support is needed unless otherwise documented below in the visit note. 

## 2016-03-30 NOTE — Assessment & Plan Note (Signed)
New.  Pt did not do well on the NuvaRing previously due to increased anxiety and worsening skin problems.  Will start low estrogen containing pill for better hormonal control and due to the fact that pt is supposed to have her period on her wedding day.  Pt expressed understanding and is in agreement w/ plan.

## 2016-03-30 NOTE — Assessment & Plan Note (Signed)
New.  Pt's sxs are consistent w/ psoriasis.  Areas tend to flare w/ her menstrual cycle.  She is using her Ultravate ointment w/ good relief but she has never had such extensive distribution which is concerning to her.  Will see if her sxs stabilize on OCPs as this will eliminate the hormonal fluctuations.  Continue steroid ointment prn.  Pt expressed understanding and is in agreement w/ plan.

## 2016-03-30 NOTE — Assessment & Plan Note (Signed)
Deteriorated.  Pt has struggling w/ the pressures of the upcoming wedding and her work schedule.  Encouraged her to take some time for herself (as she sets her own schedule) and to not put so much pressure on herself regarding the wedding.  I feel a lot of this is situational and will improve but until then, will increase the Wellbutrin to 300mg  daily.  Pt expressed understanding and is in agreement w/ plan.

## 2016-03-30 NOTE — Patient Instructions (Signed)
Follow up in 4-6 weeks to recheck anxiety Increase the Wellbutrin to 300mg  daily (2 of what you have at home and 1 of the new prescription) Use the Halobetasol twice daily as needed Start the pills on Sunday August 6th (or the Sunday after you start your next period- you can still be menstruating when you start) What you are feeling is completely normal!!!  Please stop putting so much pressure on yourself! Call with any questions or concerns Hang in there!!!

## 2016-03-30 NOTE — Progress Notes (Signed)
   Subjective:    Patient ID: Lynn Bell, female    DOB: 09-Apr-1986, 30 y.o.   MRN: [id number]  HPI Anxiety- chronic problem, on Wellbutrin daily.  Pt is very stressed w/ upcoming wedding and her current work schedule.  Crying nightly.  Pt reports feeling overwhelmed, exhausted.  Skin lesions- skin flares just prior to cycle.  Occuring on arms bilaterally, ears, and small patch on face.  Pt has hx of psoriasis but reports this has only been on her scalp.  + family hx of psoriasis.    Birth control- pt is not currently on any birth control which is what pt prefers.  Pt felt last attempt at [place] 'made me crazy'.  Last took medication 2.5 yrs ago.  Next period due 8/3.   Review of Systems For ROS see HPI     Objective:   Physical Exam  Constitutional: She is oriented to person, place, and time. She appears well-developed and well-nourished. No distress.  HENT:  Head: Normocephalic and atraumatic.  Neurological: She is alert and oriented to person, place, and time.  Skin: Skin is warm and dry.  Psoriatic patches on extensor surfaces of forearms bilaterally  Psychiatric: Her behavior is normal. Thought content normal.  Tearful, anxious  Vitals reviewed.         Assessment & Plan:

## 2016-05-04 ENCOUNTER — Encounter: Payer: Self-pay | Admitting: Family Medicine

## 2016-05-04 ENCOUNTER — Ambulatory Visit (INDEPENDENT_AMBULATORY_CARE_PROVIDER_SITE_OTHER): Payer: BLUE CROSS/BLUE SHIELD | Admitting: Family Medicine

## 2016-05-04 ENCOUNTER — Ambulatory Visit: Payer: BLUE CROSS/BLUE SHIELD | Admitting: Family Medicine

## 2016-05-04 VITALS — BP 122/81 | HR 80 | Temp 98.1°F | Resp 16 | Ht 64.0 in | Wt 152.2 lb

## 2016-05-04 DIAGNOSIS — F419 Anxiety disorder, unspecified: Secondary | ICD-10-CM | POA: Diagnosis not present

## 2016-05-04 MED ORDER — ALPRAZOLAM 0.25 MG PO TABS: 0.2500 mg | ORAL_TABLET | Freq: Two times a day (BID) | ORAL | 1 refills | Status: DC | PRN

## 2016-05-04 NOTE — Progress Notes (Signed)
   Subjective:    Patient ID: Donalda Ewings, female    DOB: 14-Aug-1986, 30 y.o.   MRN: [id number]  HPI Anxiety- pt reports feeling 'much better' since increasing Wellbutrin to 300mg  at last visit.  Pt feels she is better able to handle her stressors.  Sleep has improved- continues to take melatonin almost nightly.  Pt reports she has been able to regain some control of her work schedule.  No palpitations, SOB- no panic attacks.   Review of Systems For ROS see HPI     Objective:   Physical Exam  Constitutional: She is oriented to person, place, and time. She appears well-developed and well-nourished. No distress.  HENT:  Head: Normocephalic and atraumatic.  Neurological: She is alert and oriented to person, place, and time.  Skin: Skin is warm and dry.  Psychiatric: She has a normal mood and affect. Her behavior is normal. Thought content normal.  Vitals reviewed.         Assessment & Plan:

## 2016-05-04 NOTE — Assessment & Plan Note (Signed)
Improved since increasing Wellbutrin to 300mg  daily.  Due for refill on Alprazolam.  No med changes at this time.  Will follow.

## 2016-05-04 NOTE — Progress Notes (Signed)
Pre visit review using our clinic review tool, if applicable. No additional management support is needed unless otherwise documented below in the visit note. 

## 2016-05-04 NOTE — Patient Instructions (Signed)
Schedule your complete physical for December No med changes at this time- keep up the good work!!! Call with any questions or concerns Happy Early Birthday!!!

## 2016-08-20 ENCOUNTER — Other Ambulatory Visit: Payer: Self-pay | Admitting: Family Medicine

## 2016-08-31 ENCOUNTER — Encounter: Payer: Self-pay | Admitting: Physician Assistant

## 2016-08-31 ENCOUNTER — Ambulatory Visit (INDEPENDENT_AMBULATORY_CARE_PROVIDER_SITE_OTHER): Payer: 59 | Admitting: Physician Assistant

## 2016-08-31 VITALS — BP 118/80 | HR 114 | Temp 98.0°F | Resp 16 | Ht 64.0 in | Wt 152.0 lb

## 2016-08-31 DIAGNOSIS — Z0000 Encounter for general adult medical examination without abnormal findings: Secondary | ICD-10-CM

## 2016-08-31 DIAGNOSIS — Z23 Encounter for immunization: Secondary | ICD-10-CM

## 2016-08-31 LAB — COMPREHENSIVE METABOLIC PANEL
ALBUMIN: 4 g/dL (ref 3.6–5.1)
ALT: 11 U/L (ref 6–29)
AST: 15 U/L (ref 10–30)
Alkaline Phosphatase: 44 U/L (ref 33–115)
BUN: 12 mg/dL (ref 7–25)
CALCIUM: 8.9 mg/dL (ref 8.6–10.2)
CO2: 25 mmol/L (ref 20–31)
Chloride: 105 mmol/L (ref 98–110)
Creat: 0.8 mg/dL (ref 0.50–1.10)
GLUCOSE: 91 mg/dL (ref 65–99)
POTASSIUM: 4.2 mmol/L (ref 3.5–5.3)
Sodium: 139 mmol/L (ref 135–146)
Total Bilirubin: 0.3 mg/dL (ref 0.2–1.2)
Total Protein: 6.5 g/dL (ref 6.1–8.1)

## 2016-08-31 LAB — LIPID PANEL
CHOL/HDL RATIO: 2.2 ratio (ref <5.0)
Cholesterol: 141 mg/dL (ref <200)
HDL: 64 mg/dL (ref >50)
Triglycerides: 482 mg/dL — ABNORMAL HIGH (ref <150)

## 2016-08-31 LAB — POCT URINALYSIS DIPSTICK
BILIRUBIN UA: NEGATIVE
GLUCOSE UA: NEGATIVE
KETONES UA: NEGATIVE
Leukocytes, UA: NEGATIVE
Nitrite, UA: NEGATIVE
Protein, UA: NEGATIVE
SPEC GRAV UA: 1.02
UROBILINOGEN UA: 0.2
pH, UA: 6.5

## 2016-08-31 NOTE — Patient Instructions (Addendum)
Please go to the lab for blood work.   Our office will call you with your results unless you have chosen to receive results via MyChart.  If your blood work is normal we will follow-up each year for physicals and as scheduled for chronic medical problems.  If anything is abnormal we will treat accordingly and get you in for a follow-up.  Please continue chronic medications as directed.  Please consider [facility] -- Dr.  Lynnette Caffey  Or at [facility] -- Dr. Nehemiah Settle.   Preventive Care 18-39 Years, Female Preventive care refers to lifestyle choices and visits with your health care provider that can promote health and wellness. What does preventive care include?  A yearly physical exam. This is also called an annual well check.  Dental exams once or twice a year.  Routine eye exams. Ask your health care provider how often you should have your eyes checked.  Personal lifestyle choices, including:  Daily care of your teeth and gums.  Regular physical activity.  Eating a healthy diet.  Avoiding tobacco and drug use.  Limiting alcohol use.  Practicing safe sex.  Taking vitamin and mineral supplements as recommended by your health care provider. What happens during an annual well check? The services and screenings done by your health care provider during your annual well check will depend on your age, overall health, lifestyle risk factors, and family history of disease. Counseling  Your health care provider may ask you questions about your:  Alcohol use.  Tobacco use.  Drug use.  Emotional well-being.  Home and relationship well-being.  Sexual activity.  Eating habits.  Work and work Statistician.  Method of birth control.  Menstrual cycle.  Pregnancy history. Screening  You may have the following tests or measurements:  Height, weight, and BMI.  Diabetes screening. This is done by checking your blood sugar (glucose) after you have not eaten for a  while (fasting).  Blood pressure.  Lipid and cholesterol levels. These may be checked every 5 years starting at age 55.  Skin check.  Hepatitis C blood test.  Hepatitis B blood test.  Sexually transmitted disease (STD) testing.  BRCA-related cancer screening. This may be done if you have a family history of breast, ovarian, tubal, or peritoneal cancers.  Pelvic exam and Pap test. This may be done every 3 years starting at age 62. Starting at age 63, this may be done every 5 years if you have a Pap test in combination with an HPV test. Discuss your test results, treatment options, and if necessary, the need for more tests with your health care provider. Vaccines  Your health care provider may recommend certain vaccines, such as:  Influenza vaccine. This is recommended every year.  Tetanus, diphtheria, and acellular pertussis (Tdap, Td) vaccine. You may need a Td booster every 10 years.  Varicella vaccine. You may need this if you have not been vaccinated.  HPV vaccine. If you are 3 or younger, you may need three doses over 6 months.  Measles, mumps, and rubella (MMR) vaccine. You may need at least one dose of MMR. You may also need a second dose.  Pneumococcal 13-valent conjugate (PCV13) vaccine. You may need this if you have certain conditions and were not previously vaccinated.  Pneumococcal polysaccharide (PPSV23) vaccine. You may need one or two doses if you smoke cigarettes or if you have certain conditions.  Meningococcal vaccine. One dose is recommended if you are age 11-21 years and a first-year college  student living in a residence hall, or if you have one of several medical conditions. You may also need additional booster doses.  Hepatitis A vaccine. You may need this if you have certain conditions or if you travel or work in places where you may be exposed to hepatitis A.  Hepatitis B vaccine. You may need this if you have certain conditions or if you travel or work  in places where you may be exposed to hepatitis B.  Haemophilus influenzae type b (Hib) vaccine. You may need this if you have certain risk factors. Talk to your health care provider about which screenings and vaccines you need and how often you need them. This information is not intended to replace advice given to you by your health care provider. Make sure you discuss any questions you have with your health care provider. Document Released: 10/27/2001 Document Revised: 05/20/2016 Document Reviewed: 07/02/2015 Elsevier Interactive Patient Education  2017 [employer].

## 2016-08-31 NOTE — Progress Notes (Signed)
Pre visit review using our clinic review tool, if applicable. No additional management support is needed unless otherwise documented below in the visit note. 

## 2016-08-31 NOTE — Progress Notes (Signed)
Patient presents to clinic today for annual exam.  Patient is fasting for labs.  Acute Concerns: Denies acute concerns at today's visit  Health Maintenance: Immunizations -- Declines flu shot. Agrees to Tetanus today.  PAP -- Requests referral to GYN. Previously seen by [facility]. Would like different specialist. Endorses prior history of abnormal PAP but none recently.   Past Medical History:  Diagnosis Date  . Anxiety   . Cardiac arrhythmia   . History of chicken pox   . UTI (lower urinary tract infection)     History reviewed. No pertinent surgical history.  Current Outpatient Prescriptions on File Prior to Visit  Medication Sig Dispense Refill  . ALPRAZolam (XANAX) 0.25 MG tablet Take 1 tablet (0.25 mg total) by mouth 2 (two) times daily as needed. For anxiety 60 tablet 1  . buPROPion (WELLBUTRIN XL) 300 MG 24 hr tablet TAKE 1 TABLET(300 MG) BY MOUTH DAILY 30 tablet 6  . halobetasol (ULTRAVATE) 0.05 % ointment Apply topically 2 (two) times daily. 50 g 6   No current facility-administered medications on file prior to visit.     No Known Allergies  Family History  Problem Relation Age of Onset  . Breast cancer Maternal Grandmother     possibly paternal also  . Hyperlipidemia Father   . Heart disease Father   . Hypertension Father   . Hyperlipidemia Mother   . Heart disease Mother   . Hypertension Mother   . Kidney disease Paternal Grandfather   . Cancer Maternal Grandfather     kidney removal due to mass    Social History   Social History  . Marital status: Single    Spouse name: N/A  . Number of children: N/A  . Years of education: N/A   Occupational History  . Not on file.   Social History Main Topics  . Smoking status: Current Some Day Smoker    Packs/day: 0.25    Types: Cigarettes  . Smokeless tobacco: Never Used     Comment: 1 pack per week  . Alcohol use Yes     Comment: 1 glass of wine per night  . Drug use: No  . Sexual activity:  Yes   Other Topics Concern  . Not on file   Social History Narrative  . No narrative on file   Review of Systems  Constitutional: Negative for fever and weight loss.  HENT: Negative for ear discharge, ear pain, hearing loss and tinnitus.   Eyes: Negative for blurred vision, double vision, photophobia and pain.  Respiratory: Negative for cough and shortness of breath.   Cardiovascular: Negative for chest pain and palpitations.  Gastrointestinal: Negative for abdominal pain, blood in stool, constipation, diarrhea, heartburn, melena, nausea and vomiting.  Genitourinary: Negative for dysuria, flank pain, frequency, hematuria and urgency.  Musculoskeletal: Negative for falls.  Neurological: Negative for dizziness, loss of consciousness and headaches.  Endo/Heme/Allergies: Negative for environmental allergies.  Psychiatric/Behavioral: Negative for depression, hallucinations, substance abuse and suicidal ideas. The patient is not nervous/anxious and does not have insomnia.    BP 118/80   Pulse (!) 114   Temp 98 F (36.7 C) (Oral)   Resp 16   Ht '5\' 4"'  (1.626 m)   Wt 152 lb (68.9 kg)   SpO2 98%   BMI 26.09 kg/m   Physical Exam  Constitutional: She is oriented to person, place, and time and well-developed, well-nourished, and in no distress.  HENT:  Head: Normocephalic and atraumatic.  Right Ear:  Tympanic membrane, external ear and ear canal normal.  Left Ear: Tympanic membrane, external ear and ear canal normal.  Nose: Nose normal. No mucosal edema.  Mouth/Throat: Uvula is midline, oropharynx is clear and moist and mucous membranes are normal. No oropharyngeal exudate or posterior oropharyngeal erythema.  Eyes: Conjunctivae are normal. Pupils are equal, round, and reactive to light.  Neck: Neck supple. No thyromegaly present.  Cardiovascular: Normal rate, regular rhythm, normal heart sounds and intact distal pulses.   HR on examination within normal limits  Pulmonary/Chest: Effort  normal and breath sounds normal. No respiratory distress. She has no wheezes. She has no rales.  Abdominal: Soft. Bowel sounds are normal. She exhibits no distension and no mass. There is no tenderness. There is no rebound and no guarding.  Lymphadenopathy:    She has no cervical adenopathy.  Neurological: She is alert and oriented to person, place, and time. No cranial nerve deficit.  Skin: Skin is warm and dry. No rash noted.  Psychiatric: Affect normal.  Vitals reviewed.  Assessment/Plan: 1. Visit for preventive health examination Depression screen negative. Health Maintenance reviewed -- TDaP today. Overdue for PAP. GYN recommendations given. She will schedule an appointment.  Preventive schedule discussed and handout given in AVS. Will obtain fasting labs today.  - Comp Met (CMET); Future - Lipid Profile; Future - TSH; Future - POCT Urinalysis Dipstick - Comp Met (CMET) - Lipid Profile - TSH - Urinalysis, Routine w reflex microscopic; Future  2. Need for Tdap vaccination TDaP given today by nursing staff. - Tdap vaccine greater than or equal to 7yo IM   Leeanne Rio, PA-C

## 2016-09-01 ENCOUNTER — Other Ambulatory Visit: Payer: Self-pay | Admitting: Physician Assistant

## 2016-09-01 DIAGNOSIS — E7800 Pure hypercholesterolemia, unspecified: Secondary | ICD-10-CM

## 2016-09-01 LAB — URINALYSIS, ROUTINE W REFLEX MICROSCOPIC
Bilirubin Urine: NEGATIVE
Ketones, ur: NEGATIVE
Leukocytes, UA: NEGATIVE
NITRITE: NEGATIVE
SPECIFIC GRAVITY, URINE: 1.015 (ref 1.000–1.030)
TOTAL PROTEIN, URINE-UPE24: NEGATIVE
URINE GLUCOSE: NEGATIVE
Urobilinogen, UA: 0.2 (ref 0.0–1.0)
WBC, UA: NONE SEEN (ref 0–?)
pH: 7.5 (ref 5.0–8.0)

## 2016-09-01 LAB — TSH: TSH: 1.68 m[IU]/L

## 2016-09-01 NOTE — Addendum Note (Signed)
Addended by: Katina Dung on: 09/01/2016 10:21 AM   Modules accepted: Orders

## 2016-09-21 ENCOUNTER — Other Ambulatory Visit (INDEPENDENT_AMBULATORY_CARE_PROVIDER_SITE_OTHER): Payer: 59

## 2016-09-21 DIAGNOSIS — E7800 Pure hypercholesterolemia, unspecified: Secondary | ICD-10-CM

## 2016-09-21 LAB — LIPID PANEL
CHOLESTEROL: 183 mg/dL (ref 0–200)
HDL: 90 mg/dL (ref >39.00)
LDL Cholesterol: 79 mg/dL (ref 0–99)
NonHDL: 92.61
Total CHOL/HDL Ratio: 2
Triglycerides: 67 mg/dL (ref 0.0–149.0)
VLDL: 13.4 mg/dL (ref 0.0–40.0)

## 2016-09-22 ENCOUNTER — Encounter: Payer: Self-pay | Admitting: Emergency Medicine

## 2016-09-24 LAB — HM PAP SMEAR

## 2016-09-28 DIAGNOSIS — Z01419 Encounter for gynecological examination (general) (routine) without abnormal findings: Secondary | ICD-10-CM | POA: Diagnosis not present

## 2016-10-26 DIAGNOSIS — N911 Secondary amenorrhea: Secondary | ICD-10-CM | POA: Diagnosis not present

## 2016-11-06 DIAGNOSIS — Z13228 Encounter for screening for other metabolic disorders: Secondary | ICD-10-CM | POA: Diagnosis not present

## 2016-12-01 ENCOUNTER — Encounter: Payer: Self-pay | Admitting: Physician Assistant

## 2016-12-01 ENCOUNTER — Telehealth: Payer: Self-pay | Admitting: Family Medicine

## 2016-12-01 ENCOUNTER — Ambulatory Visit (INDEPENDENT_AMBULATORY_CARE_PROVIDER_SITE_OTHER): Payer: 59 | Admitting: Physician Assistant

## 2016-12-01 VITALS — BP 118/70 | HR 99 | Temp 99.7°F | Resp 14 | Ht 64.0 in | Wt 161.0 lb

## 2016-12-01 DIAGNOSIS — B9789 Other viral agents as the cause of diseases classified elsewhere: Secondary | ICD-10-CM

## 2016-12-01 DIAGNOSIS — J069 Acute upper respiratory infection, unspecified: Secondary | ICD-10-CM

## 2016-12-01 LAB — POCT INFLUENZA A: Rapid Influenza A Ag: NEGATIVE

## 2016-12-01 LAB — POCT RAPID STREP A (OFFICE): Rapid Strep A Screen: NEGATIVE

## 2016-12-01 NOTE — Telephone Encounter (Signed)
Please advise, ok to have pt come in for office visit?

## 2016-12-01 NOTE — Telephone Encounter (Signed)
Pt states that she has a sore throat, stuffy nose and is feeling achy all over, pt states that she is [redacted] weeks pregnant and was advised by OB to call PCP to see if she could get tested for strep and/or flu.

## 2016-12-01 NOTE — Telephone Encounter (Signed)
Yes- she will need an appt.  I know Einar Pheasant has availability

## 2016-12-01 NOTE — Telephone Encounter (Signed)
Pt has been scheduled.  °

## 2016-12-01 NOTE — Progress Notes (Signed)
Patient presents to clinic today c/o 1 day of sore throat with odynophagia, aches and fatigue. Has noted some mild post-nasal drip and dry cough. Denies chest congestion, chest pain, sinus pressure or pain. Notes ear pressure bilaterally without pain. Has taken Tylenol for aches. Denies fever, chills, nausea or vomiting. Is currently [redacted] weeks pregnant.   Past Medical History:  Diagnosis Date  . Anxiety   . Cardiac arrhythmia   . History of chicken pox   . UTI (lower urinary tract infection)     Current Outpatient Prescriptions on File Prior to Visit  Medication Sig Dispense Refill  . halobetasol (ULTRAVATE) 0.05 % ointment Apply topically 2 (two) times daily. 50 g 6  . ALPRAZolam (XANAX) 0.25 MG tablet Take 1 tablet (0.25 mg total) by mouth 2 (two) times daily as needed. For anxiety (Patient not taking: Reported on 12/01/2016) 60 tablet 1   No current facility-administered medications on file prior to visit.     No Known Allergies  Family History  Problem Relation Age of Onset  . Breast cancer Maternal Grandmother     possibly paternal also  . Hyperlipidemia Father   . Heart disease Father   . Hypertension Father   . Hyperlipidemia Mother   . Heart disease Mother   . Hypertension Mother   . Kidney disease Paternal Grandfather   . Cancer Maternal Grandfather     kidney removal due to mass    Social History   Social History  . Marital status: Single    Spouse name: N/A  . Number of children: N/A  . Years of education: N/A   Social History Main Topics  . Smoking status: Former Smoker    Packs/day: 0.25    Types: Cigarettes  . Smokeless tobacco: Never Used     Comment: 1 pack per week  . Alcohol use Yes     Comment: 1 glass of wine per night  . Drug use: No  . Sexual activity: Yes   Other Topics Concern  . None   Social History Narrative  . None   Review of Systems - See HPI.  All other ROS are negative.  BP 118/70   Pulse 99   Temp 99.7 F (37.6 C)  (Oral)   Resp 14   Ht 5\' 4"  (1.626 m)   Wt 161 lb (73 kg)   SpO2 99%   BMI 27.64 kg/m   Physical Exam  Constitutional: She is oriented to person, place, and time and well-developed, well-nourished, and in no distress.  HENT:  Head: Normocephalic and atraumatic.  Right Ear: Tympanic membrane and external ear normal.  Left Ear: Tympanic membrane and external ear normal.  Nose: Rhinorrhea present. No mucosal edema. Right sinus exhibits no maxillary sinus tenderness and no frontal sinus tenderness. Left sinus exhibits no maxillary sinus tenderness and no frontal sinus tenderness.  Mouth/Throat: Uvula is midline and mucous membranes are normal. Posterior oropharyngeal erythema present. No oropharyngeal exudate, posterior oropharyngeal edema or tonsillar abscesses.  Eyes: Conjunctivae are normal.  Neck: Neck supple.  Cardiovascular: Normal rate, regular rhythm, normal heart sounds and intact distal pulses.   Pulmonary/Chest: Effort normal and breath sounds normal. No respiratory distress. She has no wheezes. She has no rales. She exhibits no tenderness.  Lymphadenopathy:       Head (right side): No submental, no submandibular, no tonsillar, no preauricular, no posterior auricular and no occipital adenopathy present.       Head (left side): No submental, no submandibular,  no tonsillar, no preauricular, no posterior auricular and no occipital adenopathy present.    She has no cervical adenopathy.  Neurological: She is alert and oriented to person, place, and time.  Skin: Skin is warm and dry. No rash noted.  Psychiatric: Affect normal.  Vitals reviewed.  Recent Results (from the past 2160 hour(s))  Lipid panel     Status: None   Collection Time: 09/21/16  9:16 AM  Result Value Ref Range   Cholesterol 183 0 - 200 mg/dL    Comment: ATP III Classification       Desirable:  < 200 mg/dL               Borderline High:  200 - 239 mg/dL          High:  > = 240 mg/dL   Triglycerides 67.0 0.0 -  149.0 mg/dL    Comment: Normal:  <150 mg/dLBorderline High:  150 - 199 mg/dL   HDL 90.00 >39.00 mg/dL   VLDL 13.4 0.0 - 40.0 mg/dL   LDL Cholesterol 79 0 - 99 mg/dL   Total CHOL/HDL Ratio 2     Comment:                Men          Women1/2 Average Risk     3.4          3.3Average Risk          5.0          4.42X Average Risk          9.6          7.13X Average Risk          15.0          11.0                       NonHDL 92.61     Comment: NOTE:  Non-HDL goal should be 30 mg/dL higher than patient's LDL goal (i.e. LDL goal of < 70 mg/dL, would have non-HDL goal of < 100 mg/dL)   Assessment/Plan: 1. Viral URI 24 hours of symptoms. Giving pregnancy status strep testing and flu testing performed. Both negative. Examination unremarkable except for rhinorrhea and mild oropharyngeal erythema. No adenopathy or tonsillar hypertrophy. No noted exudate. Mild fever < 100 without Tylenol in over 12 hours. Discussed supportive measures and OTC medications safe for pregnancy. She is also to discuss any OTC medications with pharmacist before using. Strict return precautions given.   - POCT rapid strep A - POCT Influenza A   Leeanne Rio, PA-C

## 2016-12-01 NOTE — Telephone Encounter (Signed)
Please schedule pt

## 2016-12-01 NOTE — Progress Notes (Signed)
Pre visit review using our clinic review tool, if applicable. No additional management support is needed unless otherwise documented below in the visit note. 

## 2016-12-01 NOTE — Patient Instructions (Signed)
Your symptoms seem consistent with the start of a viral upper respiratory infection, likely a viral pharyngitis. Your flu test and strep testing are negative. I want you to hydrate very well. Get plenty of rest. Run a humidifier in the bedroom. Saline nasal rinses to flush out nasal congestion.   Tylenol if needed for fever and aches.  Discuss any use of other over-the-counter medications with your GYN or Pharmacist before taking due to pregnancy.   Follow-up if symptoms are not improving in 3-4 days of if anything worsens, for reassessment.

## 2017-03-08 DIAGNOSIS — Z23 Encounter for immunization: Secondary | ICD-10-CM | POA: Diagnosis not present

## 2017-05-24 LAB — OB RESULTS CONSOLE GBS: STREP GROUP B AG: POSITIVE

## 2017-05-27 ENCOUNTER — Encounter (HOSPITAL_COMMUNITY): Payer: Self-pay | Admitting: *Deleted

## 2017-05-27 ENCOUNTER — Inpatient Hospital Stay (EMERGENCY_DEPARTMENT_HOSPITAL)
Admission: AD | Admit: 2017-05-27 | Discharge: 2017-05-27 | Disposition: A | Discharge status: Home / Self Care | Payer: 59 | Source: Ambulatory Visit | Attending: Obstetrics and Gynecology | Admitting: Obstetrics and Gynecology

## 2017-05-27 DIAGNOSIS — Z91040 Latex allergy status: Secondary | ICD-10-CM | POA: Insufficient documentation

## 2017-05-27 DIAGNOSIS — O134 Gestational [pregnancy-induced] hypertension without significant proteinuria, complicating childbirth: Secondary | ICD-10-CM | POA: Diagnosis not present

## 2017-05-27 DIAGNOSIS — Z803 Family history of malignant neoplasm of breast: Secondary | ICD-10-CM

## 2017-05-27 DIAGNOSIS — O133 Gestational [pregnancy-induced] hypertension without significant proteinuria, third trimester: Secondary | ICD-10-CM | POA: Diagnosis not present

## 2017-05-27 DIAGNOSIS — Z87891 Personal history of nicotine dependence: Secondary | ICD-10-CM | POA: Insufficient documentation

## 2017-05-27 DIAGNOSIS — Z3A38 38 weeks gestation of pregnancy: Secondary | ICD-10-CM | POA: Insufficient documentation

## 2017-05-27 DIAGNOSIS — Z8051 Family history of malignant neoplasm of kidney: Secondary | ICD-10-CM | POA: Insufficient documentation

## 2017-05-27 DIAGNOSIS — Z8619 Personal history of other infectious and parasitic diseases: Secondary | ICD-10-CM

## 2017-05-27 DIAGNOSIS — Z8249 Family history of ischemic heart disease and other diseases of the circulatory system: Secondary | ICD-10-CM

## 2017-05-27 DIAGNOSIS — O99343 Other mental disorders complicating pregnancy, third trimester: Secondary | ICD-10-CM

## 2017-05-27 DIAGNOSIS — F419 Anxiety disorder, unspecified: Secondary | ICD-10-CM

## 2017-05-27 DIAGNOSIS — Z87440 Personal history of urinary (tract) infections: Secondary | ICD-10-CM | POA: Insufficient documentation

## 2017-05-27 DIAGNOSIS — Z3689 Encounter for other specified antenatal screening: Secondary | ICD-10-CM

## 2017-05-27 LAB — COMPREHENSIVE METABOLIC PANEL
ALBUMIN: 3.2 g/dL — AB (ref 3.5–5.0)
ALT: 20 U/L (ref 14–54)
AST: 28 U/L (ref 15–41)
Alkaline Phosphatase: 120 U/L (ref 38–126)
Anion gap: 8 (ref 5–15)
BUN: 12 mg/dL (ref 6–20)
CHLORIDE: 108 mmol/L (ref 101–111)
CO2: 20 mmol/L — AB (ref 22–32)
Calcium: 8.8 mg/dL — ABNORMAL LOW (ref 8.9–10.3)
Creatinine, Ser: 0.7 mg/dL (ref 0.44–1.00)
GFR calc Af Amer: >60 mL/min (ref >60)
GFR calc non Af Amer: >60 mL/min (ref >60)
GLUCOSE: 95 mg/dL (ref 65–99)
POTASSIUM: 3.8 mmol/L (ref 3.5–5.1)
SODIUM: 136 mmol/L (ref 135–145)
TOTAL PROTEIN: 6.6 g/dL (ref 6.5–8.1)
Total Bilirubin: 0.4 mg/dL (ref 0.3–1.2)

## 2017-05-27 LAB — PROTEIN / CREATININE RATIO, URINE
Creatinine, Urine: 32 mg/dL
Total Protein, Urine: <6 mg/dL

## 2017-05-27 LAB — URINALYSIS, ROUTINE W REFLEX MICROSCOPIC
BILIRUBIN URINE: NEGATIVE
Glucose, UA: NEGATIVE mg/dL
Hgb urine dipstick: NEGATIVE
KETONES UR: NEGATIVE mg/dL
Nitrite: NEGATIVE
PROTEIN: NEGATIVE mg/dL
Specific Gravity, Urine: 1.004 — ABNORMAL LOW (ref 1.005–1.030)
pH: 6 (ref 5.0–8.0)

## 2017-05-27 LAB — CBC
HEMATOCRIT: 33.4 % — AB (ref 36.0–46.0)
HEMOGLOBIN: 11.4 g/dL — AB (ref 12.0–15.0)
MCH: 30 pg (ref 26.0–34.0)
MCHC: 34.1 g/dL (ref 30.0–36.0)
MCV: 87.9 fL (ref 78.0–100.0)
Platelets: 200 10*3/uL (ref 150–400)
RBC: 3.8 MIL/uL — ABNORMAL LOW (ref 3.87–5.11)
RDW: 14.2 % (ref 11.5–15.5)
WBC: 10.5 10*3/uL (ref 4.0–10.5)

## 2017-05-27 MED ORDER — HYDRALAZINE HCL 20 MG/ML IJ SOLN: 10.0000 mg | Freq: Once | INTRAMUSCULAR | Status: DC | PRN

## 2017-05-27 MED ORDER — LABETALOL HCL 5 MG/ML IV SOLN: 20.0000 mg | INTRAVENOUS | Status: DC | PRN

## 2017-05-27 NOTE — MAU Note (Signed)
Pt reports she was sent over from the office due to elevated b/p. Denies headache, or blurred vision.

## 2017-05-27 NOTE — MAU Provider Note (Signed)
History     CSN: [id number]  Arrival date and time: 05/27/17 1533  First Provider Initiated Contact with Patient 05/27/17 1611      Chief Complaint  Patient presents with  . Hypertension   HPI Lynn Bell is a 31 y.o. G1P0 at [redacted]w[redacted]d who presents from the office for BP evaluation. ROB visit with Dr. Royston Sinner today & BP was 150s/80s. Patient reports isolated elevated BPs in office 2-3 weeks ago, but none since. Denies hypertension outside of pregnancy. Denies headache, visual disturbance, epigastric pain, or abdominal pain. Positive fetal movement.   OB History    Gravida Para Term Preterm AB Living   1             SAB TAB Ectopic Multiple Live Births                  Past Medical History:  Diagnosis Date  . Anxiety   . Cardiac arrhythmia   . History of chicken pox   . UTI (lower urinary tract infection)     History reviewed. No pertinent surgical history.  Family History  Problem Relation Age of Onset  . Breast cancer Maternal Grandmother        possibly paternal also  . Hyperlipidemia Father   . Heart disease Father   . Hypertension Father   . Hyperlipidemia Mother   . Heart disease Mother   . Hypertension Mother   . Kidney disease Paternal Grandfather   . Cancer Maternal Grandfather        kidney removal due to mass    Social History  Substance Use Topics  . Smoking status: Former Smoker    Packs/day: 0.25    Types: Cigarettes  . Smokeless tobacco: Never Used     Comment: 1 pack per week  . Alcohol use Yes     Comment: 1 glass of wine per night    Allergies:  Allergies  Allergen Reactions  . Latex     Pt "prefers" no latex, but not an allergy Sensitive skin    Prescriptions Prior to Admission  Medication Sig Dispense Refill Last Dose  . ALPRAZolam (XANAX) 0.25 MG tablet Take 1 tablet (0.25 mg total) by mouth 2 (two) times daily as needed. For anxiety (Patient not taking: Reported on 12/01/2016) 60 tablet 1 Not Taking  . buPROPion (WELLBUTRIN XL)  150 MG 24 hr tablet Take 150 mg by mouth daily.   Taking  . halobetasol (ULTRAVATE) 0.05 % ointment Apply topically 2 (two) times daily. 50 g 6 Taking    Review of Systems  Constitutional: Negative.   Eyes: Negative for visual disturbance.  Gastrointestinal: Negative.   Genitourinary: Negative.   Neurological: Negative for dizziness and headaches.   Physical Exam   Blood pressure (!) 142/90, pulse 87, temperature 98.7 F (37.1 C), temperature source Oral, resp. rate 18, SpO2 99 %. Patient Vitals for the past 24 hrs:  BP Temp Temp src Pulse Resp SpO2  05/27/17 1730 (!) 142/90 - - 87 - -  05/27/17 1715 137/89 - - 90 - -  05/27/17 1700 (!) 143/87 - - 89 - -  05/27/17 1646 (!) 143/95 - - 96 - -  05/27/17 1631 (!) 144/89 - - 87 - -  05/27/17 1615 (!) 151/81 - - 95 - -  05/27/17 1600 (!) 143/90 - - 91 - -  05/27/17 1553 (!) 154/87 - - 90 - -  05/27/17 1540 (!) 162/98 98.7 F (37.1 C) Oral (!) 105 18 99 %  Physical Exam  Nursing note and vitals reviewed. Constitutional: She is oriented to person, place, and time. She appears well-developed and well-nourished. No distress.  HENT:  Head: Normocephalic and atraumatic.  Eyes: Conjunctivae are normal. Right eye exhibits no discharge. Left eye exhibits no discharge. No scleral icterus.  Neck: Normal range of motion.  Cardiovascular: Normal rate, regular rhythm and normal heart sounds.   No murmur heard. Respiratory: Effort normal and breath sounds normal. No respiratory distress. She has no wheezes.  GI: Soft. There is no tenderness.  Musculoskeletal: She exhibits edema (BLE 2+).  Neurological: She is alert and oriented to person, place, and time. She has normal reflexes.  No clonus  Skin: Skin is warm and dry. She is not diaphoretic.  Psychiatric: She has a normal mood and affect. Her behavior is normal. Judgment and thought content normal.   Fetal Tracing:  Baseline: 135 Variability: moderate Accelerations:  15x15 Decelerations: none  Toco: irr ctx MAU Course  Procedures Results for orders placed or performed during the hospital encounter of 05/27/17 (from the past 24 hour(s))  Protein / creatinine ratio, urine     Status: None   Collection Time: 05/27/17  3:40 PM  Result Value Ref Range   Creatinine, Urine 32.00 mg/dL   Total Protein, Urine <6 mg/dL   Protein Creatinine Ratio        0.00 - 0.15 mg/mg[Cre]  Urinalysis, Routine w reflex microscopic     Status: Abnormal   Collection Time: 05/27/17  3:40 PM  Result Value Ref Range   Color, Urine YELLOW YELLOW   APPearance HAZY (A) CLEAR   Specific Gravity, Urine 1.004 (L) 1.005 - 1.030   pH 6.0 5.0 - 8.0   Glucose, UA NEGATIVE NEGATIVE mg/dL   Hgb urine dipstick NEGATIVE NEGATIVE   Bilirubin Urine NEGATIVE NEGATIVE   Ketones, ur NEGATIVE NEGATIVE mg/dL   Protein, ur NEGATIVE NEGATIVE mg/dL   Nitrite NEGATIVE NEGATIVE   Leukocytes, UA TRACE (A) NEGATIVE   RBC / HPF 0-5 0 - 5 RBC/hpf   WBC, UA 6-30 0 - 5 WBC/hpf   Bacteria, UA MANY (A) NONE SEEN   Squamous Epithelial / LPF 6-30 (A) NONE SEEN  Comprehensive metabolic panel     Status: Abnormal   Collection Time: 05/27/17  4:13 PM  Result Value Ref Range   Sodium 136 135 - 145 mmol/L   Potassium 3.8 3.5 - 5.1 mmol/L   Chloride 108 101 - 111 mmol/L   CO2 20 (L) 22 - 32 mmol/L   Glucose, Bld 95 65 - 99 mg/dL   BUN 12 6 - 20 mg/dL   Creatinine, Ser 0.70 0.44 - 1.00 mg/dL   Calcium 8.8 (L) 8.9 - 10.3 mg/dL   Total Protein 6.6 6.5 - 8.1 g/dL   Albumin 3.2 (L) 3.5 - 5.0 g/dL   AST 28 15 - 41 U/L   ALT 20 14 - 54 U/L   Alkaline Phosphatase 120 38 - 126 U/L   Total Bilirubin 0.4 0.3 - 1.2 mg/dL   GFR calc non Af Amer >60 >60 mL/min   GFR calc Af Amer >60 >60 mL/min   Anion gap 8 5 - 15  CBC     Status: Abnormal   Collection Time: 05/27/17  4:13 PM  Result Value Ref Range   WBC 10.5 4.0 - 10.5 K/uL   RBC 3.80 (L) 3.87 - 5.11 MIL/uL   Hemoglobin 11.4 (L) 12.0 - 15.0 g/dL   HCT  33.4 (L) 36.0 -  46.0 %   MCV 87.9 78.0 - 100.0 fL   MCH 30.0 26.0 - 34.0 pg   MCHC 34.1 30.0 - 36.0 g/dL   RDW 14.2 11.5 - 15.5 %   Platelets 200 150 - 400 K/uL    MDM Reactive fetal tracing. Initial BP in severe range; rest of BPs elevated but none further in severe range. CBC, CMP, urine PCR --- preE labs normal.  C/w Dr. Helane Rima. Ok to discharge home. Pt stable. To call office in morning to schedule IOL on Sunday.   Assessment and Plan  A: 1. Pregnancy-induced hypertension in third trimester   2. [redacted] weeks gestation of pregnancy   3. NST (non-stress test) reactive    P; Discharge home Discussed reasons to return to MAU including s/s preeclampsia Call [facility] in the morning to schedule IOL for Sunday  Jorje Guild 05/27/2017, 4:10 PM

## 2017-05-27 NOTE — Discharge Instructions (Signed)
Hypertension During Pregnancy °Hypertension, commonly called high blood pressure, is when the force of blood pumping through your arteries is too strong. Arteries are blood vessels that carry blood from the heart throughout the body. Hypertension during pregnancy can cause problems for you and your baby. Your baby may be born early (prematurely) or may not weigh as much as he or she should at birth. Very bad cases of hypertension during pregnancy can be life-threatening. °Different types of hypertension can occur during pregnancy. These include: °· Chronic hypertension. This happens when: °? You have hypertension before pregnancy and it continues during pregnancy. °? You develop hypertension before you are [redacted] weeks pregnant, and it continues during pregnancy. °· Gestational hypertension. This is hypertension that develops after the 20th week of pregnancy. °· Preeclampsia, also called toxemia of pregnancy. This is a very serious type of hypertension that develops only during pregnancy. It affects the whole body, and it can be very dangerous for you and your baby. ° °Gestational hypertension and preeclampsia usually go away within 6 weeks after your baby is born. Women who have hypertension during pregnancy have a greater chance of developing hypertension later in life or during future pregnancies. °What are the causes? °The exact cause of hypertension is not known. °What increases the risk? °There are certain factors that make it more likely for you to develop hypertension during pregnancy. These include: °· Having hypertension during a previous pregnancy or prior to pregnancy. °· Being overweight. °· Being older than age 40. °· Being pregnant for the first time or being pregnant with more than one baby. °· Becoming pregnant using fertilization methods such as IVF (in vitro fertilization). °· Having diabetes, kidney problems, or systemic lupus erythematosus. °· Having a family history of hypertension. ° °What are the  signs or symptoms? °Chronic hypertension and gestational hypertension rarely cause symptoms. Preeclampsia causes symptoms, which may include: °· Increased protein in your urine. Your health care provider will check for this at every visit before you give birth (prenatal visit). °· Severe headaches. °· Sudden weight gain. °· Swelling of the hands, face, legs, and feet. °· Nausea and vomiting. °· Vision problems, such as blurred or double vision. °· Numbness in the face, arms, legs, and feet. °· Dizziness. °· Slurred speech. °· Sensitivity to bright lights. °· Abdominal pain. °· Convulsions. ° °How is this diagnosed? °You may be diagnosed with hypertension during a routine prenatal exam. At each prenatal visit, you may: °· Have a urine test to check for high amounts of protein in your urine. °· Have your blood pressure checked. A blood pressure reading is recorded as two numbers, such as "120 over 80" (or 120/80). The first ("top") number is called the systolic pressure. It is a measure of the pressure in your arteries when your heart beats. The second ("bottom") number is called the diastolic pressure. It is a measure of the pressure in your arteries as your heart relaxes between beats. Blood pressure is measured in a unit called mm Hg. A normal blood pressure reading is: °? Systolic: below 120. °? Diastolic: below 80. ° °The type of hypertension that you are diagnosed with depends on your test results and when your symptoms developed. °· Chronic hypertension is usually diagnosed before 20 weeks of pregnancy. °· Gestational hypertension is usually diagnosed after 20 weeks of pregnancy. °· Hypertension with high amounts of protein in the urine is diagnosed as preeclampsia. °· Blood pressure measurements that stay above 160 systolic, or above 110 diastolic, are   signs of severe preeclampsia. ° °How is this treated? °Treatment for hypertension during pregnancy varies depending on the type of hypertension you have and how  serious it is. °· If you take medicines called ACE inhibitors to treat chronic hypertension, you may need to switch medicines. ACE inhibitors should not be taken during pregnancy. °· If you have gestational hypertension, you may need to take blood pressure medicine. °· If you are at risk for preeclampsia, your health care provider may recommend that you take a low-dose aspirin every day to prevent high blood pressure during your pregnancy. °· If you have severe preeclampsia, you may need to be hospitalized so you and your baby can be monitored closely. You may also need to take medicine (magnesium sulfate) to prevent seizures and to lower blood pressure. This medicine may be given as an injection or through an IV tube. °· In some cases, if your condition gets worse, you may need to deliver your baby early. ° °Follow these instructions at home: °Eating and drinking °· Drink enough fluid to keep your urine clear or pale yellow. °· Eat a healthy diet that is low in salt (sodium). Do not add salt to your food. Check food labels to see how much sodium a food or beverage contains. °Lifestyle °· Do not use any products that contain nicotine or tobacco, such as cigarettes and e-cigarettes. If you need help quitting, ask your health care provider. °· Do not use alcohol. °· Avoid caffeine. °· Avoid stress as much as possible. Rest and get plenty of sleep. °General instructions °· Take over-the-counter and prescription medicines only as told by your health care provider. °· While lying down, lie on your left side. This keeps pressure off your baby. °· While sitting or lying down, raise (elevate) your feet. Try putting some pillows under your lower legs. °· Exercise regularly. Ask your health care provider what kinds of exercise are best for you. °· Keep all prenatal and follow-up visits as told by your health care provider. This is important. °Contact a health care provider if: °· You have symptoms that your health care  provider told you may require more treatment or monitoring, such as: °? Fever. °? Vomiting. °? Headache. °Get help right away if: °· You have severe abdominal pain or vomiting that does not get better with treatment. °· You suddenly develop swelling in your hands, ankles, or face. °· You gain 4 lbs (1.8 kg) or more in 1 week. °· You develop vaginal bleeding, or you have blood in your urine. °· You do not feel your baby moving as much as usual. °· You have blurred or double vision. °· You have muscle twitching or sudden tightening (spasms). °· You have shortness of breath. °· Your lips or fingernails turn blue. °This information is not intended to replace advice given to you by your health care provider. Make sure you discuss any questions you have with your health care provider. °Document Released: 05/19/2011 Document Revised: 03/20/2016 Document Reviewed: 02/14/2016 °Elsevier Interactive Patient Education © 2018 [employer] ° °

## 2017-05-28 ENCOUNTER — Other Ambulatory Visit: Payer: Self-pay | Admitting: Obstetrics and Gynecology

## 2017-05-28 NOTE — Progress Notes (Signed)
HPI: 31 yo G2P0010 @ 38.6 wga presenting for IOL s/s gHTN.   Past Medical History:  Diagnosis Date  . Anxiety   . Cardiac arrhythmia   . History of chicken pox   . UTI (lower urinary tract infection)    No past surgical history on file.  OB History  Gravida Para Term Preterm AB Living  1            SAB TAB Ectopic Multiple Live Births               # Outcome Date GA Lbr Len/2nd Weight Sex Delivery Anes PTL Lv  1 Current              O: There were no vitals filed for this visit. (in office) (from clinic) NAD, A&O NWOB Abd soft, nondistended, gravid SVE closed  OB Labs: A+/Ab neg Rubella Immune Hep B/HIV/RPR all NR GBS pos  A/P: 31 yo G2P0010 @ 38.6 wga presenting for IOL s/s gHTN. PIH labs WNL. No s/s of severe dz. Cervix closed. Anticipate cytotec followed by pitocin/AROM when cervix more favorable. GBS pos - PCN per protocol.   Arty Baumgartner MD

## 2017-05-30 ENCOUNTER — Inpatient Hospital Stay (HOSPITAL_COMMUNITY)
Admission: AD | Admit: 2017-05-30 | Discharge: 2017-06-04 | DRG: 766 | Disposition: A | Discharge status: Home / Self Care | Payer: 59 | Source: Ambulatory Visit | Attending: Obstetrics and Gynecology | Admitting: Obstetrics and Gynecology

## 2017-05-30 ENCOUNTER — Encounter (HOSPITAL_COMMUNITY): Payer: Self-pay | Admitting: *Deleted

## 2017-05-30 DIAGNOSIS — O324XX0 Maternal care for high head at term, not applicable or unspecified: Secondary | ICD-10-CM | POA: Diagnosis present

## 2017-05-30 DIAGNOSIS — Z91040 Latex allergy status: Secondary | ICD-10-CM | POA: Diagnosis not present

## 2017-05-30 DIAGNOSIS — Z3A38 38 weeks gestation of pregnancy: Secondary | ICD-10-CM

## 2017-05-30 DIAGNOSIS — O99824 Streptococcus B carrier state complicating childbirth: Secondary | ICD-10-CM | POA: Diagnosis present

## 2017-05-30 DIAGNOSIS — Z87891 Personal history of nicotine dependence: Secondary | ICD-10-CM | POA: Diagnosis not present

## 2017-05-30 DIAGNOSIS — Z3490 Encounter for supervision of normal pregnancy, unspecified, unspecified trimester: Secondary | ICD-10-CM

## 2017-05-30 DIAGNOSIS — O139 Gestational [pregnancy-induced] hypertension without significant proteinuria, unspecified trimester: Secondary | ICD-10-CM

## 2017-05-30 DIAGNOSIS — O134 Gestational [pregnancy-induced] hypertension without significant proteinuria, complicating childbirth: Principal | ICD-10-CM | POA: Diagnosis present

## 2017-05-30 LAB — CBC
HCT: 33.9 % — ABNORMAL LOW (ref 36.0–46.0)
HEMOGLOBIN: 11.4 g/dL — AB (ref 12.0–15.0)
MCH: 29.8 pg (ref 26.0–34.0)
MCHC: 33.6 g/dL (ref 30.0–36.0)
MCV: 88.5 fL (ref 78.0–100.0)
Platelets: 209 10*3/uL (ref 150–400)
RBC: 3.83 MIL/uL — AB (ref 3.87–5.11)
RDW: 14.1 % (ref 11.5–15.5)
WBC: 9.8 10*3/uL (ref 4.0–10.5)

## 2017-05-30 LAB — PROTEIN / CREATININE RATIO, URINE
Creatinine, Urine: 139 mg/dL
PROTEIN CREATININE RATIO: 0.12 mg/mg{creat} (ref 0.00–0.15)
TOTAL PROTEIN, URINE: 17 mg/dL

## 2017-05-30 LAB — COMPREHENSIVE METABOLIC PANEL
ALBUMIN: 3.1 g/dL — AB (ref 3.5–5.0)
ALT: 19 U/L (ref 14–54)
AST: 27 U/L (ref 15–41)
Alkaline Phosphatase: 133 U/L — ABNORMAL HIGH (ref 38–126)
Anion gap: 7 (ref 5–15)
BUN: 14 mg/dL (ref 6–20)
CHLORIDE: 107 mmol/L (ref 101–111)
CO2: 23 mmol/L (ref 22–32)
CREATININE: 0.73 mg/dL (ref 0.44–1.00)
Calcium: 8.9 mg/dL (ref 8.9–10.3)
GFR calc Af Amer: >60 mL/min (ref >60)
GFR calc non Af Amer: >60 mL/min (ref >60)
GLUCOSE: 84 mg/dL (ref 65–99)
Potassium: 4.2 mmol/L (ref 3.5–5.1)
SODIUM: 137 mmol/L (ref 135–145)
Total Bilirubin: 0.4 mg/dL (ref 0.3–1.2)
Total Protein: 6.1 g/dL — ABNORMAL LOW (ref 6.5–8.1)

## 2017-05-30 LAB — TYPE AND SCREEN
ABO/RH(D): A POS
Antibody Screen: NEGATIVE

## 2017-05-30 MED ORDER — LIDOCAINE HCL (PF) 1 % IJ SOLN: 30.0000 mL | INTRAMUSCULAR | Status: DC | PRN

## 2017-05-30 MED ORDER — SOD CITRATE-CITRIC ACID 500-334 MG/5ML PO SOLN
30.0000 mL | ORAL | Status: DC | PRN
Start: 2017-05-30 — End: 2017-06-01
  Administered 2017-06-01: 30 mL via ORAL
  Filled 2017-05-30: qty 15

## 2017-05-30 MED ORDER — LACTATED RINGERS IV SOLN: 500.0000 mL | INTRAVENOUS | Status: DC | PRN

## 2017-05-30 MED ORDER — MISOPROSTOL 25 MCG QUARTER TABLET
25.0000 ug | ORAL_TABLET | ORAL | Status: DC | PRN
Start: 2017-05-30 — End: 2017-06-01
  Administered 2017-05-30 – 2017-05-31 (×3): 25 ug via VAGINAL
  Filled 2017-05-30 (×4): qty 1

## 2017-05-30 MED ORDER — ACETAMINOPHEN 325 MG PO TABS: 650.0000 mg | ORAL_TABLET | ORAL | Status: DC | PRN

## 2017-05-30 MED ORDER — OXYTOCIN 40 UNITS IN LACTATED RINGERS INFUSION - SIMPLE MED: 2.5000 [IU]/h | INTRAVENOUS | Status: DC

## 2017-05-30 MED ORDER — ONDANSETRON HCL 4 MG/2ML IJ SOLN
4.0000 mg | Freq: Four times a day (QID) | INTRAMUSCULAR | Status: DC | PRN
  Administered 2017-06-01: 4 mg via INTRAVENOUS
  Filled 2017-05-30: qty 2

## 2017-05-30 MED ORDER — PENICILLIN G POT IN DEXTROSE 60000 UNIT/ML IV SOLN
3.0000 10*6.[IU] | INTRAVENOUS | Status: DC
  Filled 2017-05-30 (×4): qty 50

## 2017-05-30 MED ORDER — OXYCODONE-ACETAMINOPHEN 5-325 MG PO TABS: 2.0000 | ORAL_TABLET | ORAL | Status: DC | PRN

## 2017-05-30 MED ORDER — OXYCODONE-ACETAMINOPHEN 5-325 MG PO TABS
1.0000 | ORAL_TABLET | ORAL | Status: DC | PRN
Start: 2017-05-30 — End: 2017-06-01

## 2017-05-30 MED ORDER — PENICILLIN G POTASSIUM 5000000 UNITS IJ SOLR
5.0000 10*6.[IU] | Freq: Once | INTRAMUSCULAR | Status: DC
  Filled 2017-05-30: qty 5

## 2017-05-30 MED ORDER — TERBUTALINE SULFATE 1 MG/ML IJ SOLN: 0.2500 mg | Freq: Once | INTRAMUSCULAR | Status: DC | PRN

## 2017-05-30 MED ORDER — OXYTOCIN 40 UNITS IN LACTATED RINGERS INFUSION - SIMPLE MED: 1.0000 m[IU]/min | INTRAVENOUS | Status: DC

## 2017-05-30 MED ORDER — OXYTOCIN BOLUS FROM INFUSION: 500.0000 mL | Freq: Once | INTRAVENOUS | Status: DC

## 2017-05-30 MED ORDER — FLEET ENEMA 7-19 GM/118ML RE ENEM: 1.0000 | ENEMA | RECTAL | Status: DC | PRN

## 2017-05-30 MED ORDER — LACTATED RINGERS IV SOLN
INTRAVENOUS | Status: DC
  Administered 2017-05-31 – 2017-06-01 (×5): via INTRAVENOUS

## 2017-05-30 NOTE — MAU Provider Note (Signed)
History     CSN: [id number]  Arrival date and time: 05/30/17 1827   First Provider Initiated Contact with Patient 05/30/17 1908      Chief Complaint  Patient presents with  . Hypertension   Lynn Bell is a 31 y.o. G1P0 at [redacted]w[redacted]d followed for gestational hypertension presenting for serial BPs and preeclampsia labs, then IOL admission per Dr. Royston Sinner. She denies headache, visual disturbance, epigastric pain, painful contractions. No leakage of fluid or vaginal bleeding. Cervix closed at last prenatal visit 3 days ago. No other concerns or pregnancy complications.       Past Medical History:  Diagnosis Date  . Anxiety   . Cardiac arrhythmia   . History of chicken pox   . UTI (lower urinary tract infection)     No past surgical history on file.  Family History  Problem Relation Age of Onset  . Breast cancer Maternal Grandmother        possibly paternal also  . Hyperlipidemia Father   . Heart disease Father   . Hypertension Father   . Hyperlipidemia Mother   . Heart disease Mother   . Hypertension Mother   . Kidney disease Paternal Grandfather   . Cancer Maternal Grandfather        kidney removal due to mass    Social History  Substance Use Topics  . Smoking status: Former Smoker    Packs/day: 0.25    Types: Cigarettes  . Smokeless tobacco: Never Used     Comment: 1 pack per week  . Alcohol use Yes     Comment: 1 glass of wine per night    Allergies:  Allergies  Allergen Reactions  . Latex     Pt "prefers" no latex, but not an allergy Sensitive skin    Prescriptions Prior to Admission  Medication Sig Dispense Refill Last Dose  . acetaminophen (TYLENOL) 500 MG tablet Take 500 mg by mouth every 6 (six) hours as needed for headache.   Past Week at Unknown time  . ALPRAZolam (XANAX) 0.25 MG tablet Take 1 tablet (0.25 mg total) by mouth 2 (two) times daily as needed. For anxiety (Patient not taking: Reported on 12/01/2016) 60 tablet 1 Not Taking  . buPROPion  (WELLBUTRIN XL) 150 MG 24 hr tablet Take 150 mg by mouth daily.   05/27/2017 at Unknown time  . halobetasol (ULTRAVATE) 0.05 % ointment Apply topically 2 (two) times daily. 50 g 6 prn  . MELATONIN GUMMIES PO Take 5 mg by mouth at bedtime as needed.   Past Week at Unknown time  . Prenatal Vit-Fe Fumarate-FA (PRENATAL MULTIVITAMIN) TABS tablet Take 1 tablet by mouth daily at 12 noon.   05/26/2017 at Unknown time    Review of Systems  Constitutional: Negative for fatigue and fever.  Eyes: Negative for visual disturbance.  Cardiovascular: Negative for chest pain and leg swelling.  Gastrointestinal: Negative for nausea and vomiting.  Genitourinary: Negative for dysuria, vaginal bleeding and vaginal discharge.   Physical Exam   Blood pressure 138/78, pulse 87. Vitals:   05/30/17 1920 05/30/17 1934 05/30/17 2001  BP: (!) 162/80 138/78 (!) 143/86  Pulse: 85 87 86   Physical Exam  Nursing note and vitals reviewed. Constitutional: She is oriented to person, place, and time. She appears well-developed and well-nourished. No distress.  HENT:  Head: Normocephalic.  Eyes: Pupils are equal, round, and reactive to light.  Cardiovascular: Normal rate.   Respiratory: Effort normal.  GI: Soft. There is no tenderness.  Genitourinary:  Genitourinary Comments: VE deferred  Musculoskeletal: Normal range of motion.  Neurological: She is alert and oriented to person, place, and time.  Skin: Skin is warm.  Psychiatric: She has a normal mood and affect. Her behavior is normal.    MAU Course  Procedures  Fetal monitoring Baseline heart rate 125-1:30, moderate variability, accelerations present, no decelerations Toco: Occasional mild UC Bedside US by me> confirms cephalic  MDM C/W Dr. Royston Sinner admit  Assessment and Plan  31 yo G1 at [redacted]w[redacted]d 1. Gestational hypertension affecting third pregnancy   FWB by EFM    Zarin Knupp CNM 05/30/2017, 7:09 PM

## 2017-05-30 NOTE — MAU Note (Signed)
Urine in lab 

## 2017-05-30 NOTE — H&P (Signed)
Lynn Bell is a 31 y.o. female G2P0010 @ 65.6 wga presenting for IOL s/s gHTN.. OB History    Gravida Para Term Preterm AB Living   1             SAB TAB Ectopic Multiple Live Births                 Past Medical History:  Diagnosis Date  . Anxiety   . Cardiac arrhythmia   . History of chicken pox   . UTI (lower urinary tract infection)    No past surgical history on file. Family History: family history includes Breast cancer in her maternal grandmother; Cancer in her maternal grandfather; Heart disease in her father and mother; Hyperlipidemia in her father and mother; Hypertension in her father and mother; Kidney disease in her paternal grandfather. Social History:  reports that she has quit smoking. Her smoking use included Cigarettes. She smoked 0.25 packs per day. She has never used smokeless tobacco. She reports that she drinks alcohol. She reports that she does not use drugs.     Maternal Diabetes: No Genetic Screening: Normal Maternal Ultrasounds/Referrals: Normal Fetal Ultrasounds or other Referrals:  None Maternal Substance Abuse:  No Significant Maternal Medications:  None Significant Maternal Lab Results:  None Other Comments:  None  ROS History   There were no vitals taken for this visit. Exam Physical Exam  NAD, A&O NWOB Abd soft, nondistended, gravid SVE closed  Prenatal labs: ABO, Rh:  A+ Antibody:  neg Rubella:  Immune RPR:   NR HBsAg:  NR  HIV:   NR GBS:   pos  Assessment/Plan: 31yo G2P0010 @ 38.6 wga presenting for IOL s/s gHTN. PIH labs WNL. No s/s of severe dz. Cervix closed. Anticipate cytotec followed by pitocin/AROM when cervix more favorable. GBS pos - PCN per protocol.  Tyson Dense 05/30/2017, 7:09 PM

## 2017-05-31 ENCOUNTER — Inpatient Hospital Stay (HOSPITAL_COMMUNITY): Payer: 59 | Admitting: Anesthesiology

## 2017-05-31 LAB — OB RESULTS CONSOLE RPR: RPR: NONREACTIVE

## 2017-05-31 LAB — OB RESULTS CONSOLE HEPATITIS B SURFACE ANTIGEN: HEP B S AG: NEGATIVE

## 2017-05-31 LAB — COMPREHENSIVE METABOLIC PANEL
ALT: 17 U/L (ref 14–54)
ANION GAP: 9 (ref 5–15)
AST: 26 U/L (ref 15–41)
Albumin: 2.9 g/dL — ABNORMAL LOW (ref 3.5–5.0)
Alkaline Phosphatase: 133 U/L — ABNORMAL HIGH (ref 38–126)
BUN: 10 mg/dL (ref 6–20)
CHLORIDE: 106 mmol/L (ref 101–111)
CO2: 21 mmol/L — AB (ref 22–32)
Calcium: 8.6 mg/dL — ABNORMAL LOW (ref 8.9–10.3)
Creatinine, Ser: 0.63 mg/dL (ref 0.44–1.00)
GFR calc non Af Amer: >60 mL/min (ref >60)
Glucose, Bld: 84 mg/dL (ref 65–99)
Potassium: 4.3 mmol/L (ref 3.5–5.1)
SODIUM: 136 mmol/L (ref 135–145)
Total Bilirubin: 0.4 mg/dL (ref 0.3–1.2)
Total Protein: 5.8 g/dL — ABNORMAL LOW (ref 6.5–8.1)

## 2017-05-31 LAB — CBC
HCT: 34.7 % — ABNORMAL LOW (ref 36.0–46.0)
HEMATOCRIT: 34.8 % — AB (ref 36.0–46.0)
Hemoglobin: 11.8 g/dL — ABNORMAL LOW (ref 12.0–15.0)
Hemoglobin: 11.8 g/dL — ABNORMAL LOW (ref 12.0–15.0)
MCH: 30 pg (ref 26.0–34.0)
MCH: 30.2 pg (ref 26.0–34.0)
MCHC: 34 g/dL (ref 30.0–36.0)
MCHC: 33.9 g/dL (ref 30.0–36.0)
MCV: 88.3 fL (ref 78.0–100.0)
MCV: 89 fL (ref 78.0–100.0)
PLATELETS: 199 10*3/uL (ref 150–400)
Platelets: 190 10*3/uL (ref 150–400)
RBC: 3.93 MIL/uL (ref 3.87–5.11)
RBC: 3.91 MIL/uL (ref 3.87–5.11)
RDW: 14.1 % (ref 11.5–15.5)
RDW: 14.2 % (ref 11.5–15.5)
WBC: 10.5 10*3/uL (ref 4.0–10.5)
WBC: 11.9 10*3/uL — AB (ref 4.0–10.5)

## 2017-05-31 LAB — RPR: RPR Ser Ql: NONREACTIVE

## 2017-05-31 LAB — OB RESULTS CONSOLE HIV ANTIBODY (ROUTINE TESTING): HIV: NONREACTIVE

## 2017-05-31 LAB — OB RESULTS CONSOLE GC/CHLAMYDIA
Chlamydia: NEGATIVE
Gonorrhea: NEGATIVE

## 2017-05-31 LAB — OB RESULTS CONSOLE RUBELLA ANTIBODY, IGM: Rubella: IMMUNE

## 2017-05-31 LAB — ABO/RH: ABO/RH(D): A POS

## 2017-05-31 MED ORDER — PHENYLEPHRINE 40 MCG/ML (10ML) SYRINGE FOR IV PUSH (FOR BLOOD PRESSURE SUPPORT)
80.0000 ug | PREFILLED_SYRINGE | INTRAVENOUS | Status: DC | PRN
  Filled 2017-05-31: qty 10

## 2017-05-31 MED ORDER — PHENYLEPHRINE 40 MCG/ML (10ML) SYRINGE FOR IV PUSH (FOR BLOOD PRESSURE SUPPORT): 80.0000 ug | PREFILLED_SYRINGE | INTRAVENOUS | Status: DC | PRN

## 2017-05-31 MED ORDER — OXYTOCIN 40 UNITS IN LACTATED RINGERS INFUSION - SIMPLE MED
1.0000 m[IU]/min | INTRAVENOUS | Status: DC
  Administered 2017-05-31: 1 m[IU]/min via INTRAVENOUS
  Filled 2017-05-31: qty 1000

## 2017-05-31 MED ORDER — TERBUTALINE SULFATE 1 MG/ML IJ SOLN: 0.2500 mg | Freq: Once | INTRAMUSCULAR | Status: DC | PRN

## 2017-05-31 MED ORDER — EPHEDRINE 5 MG/ML INJ: 10.0000 mg | INTRAVENOUS | Status: DC | PRN

## 2017-05-31 MED ORDER — PENICILLIN G POTASSIUM 5000000 UNITS IJ SOLR
5.0000 10*6.[IU] | Freq: Once | INTRAVENOUS | Status: AC
  Administered 2017-05-31: 5 10*6.[IU] via INTRAVENOUS
  Filled 2017-05-31: qty 5

## 2017-05-31 MED ORDER — PENICILLIN G POT IN DEXTROSE 60000 UNIT/ML IV SOLN
3.0000 10*6.[IU] | INTRAVENOUS | Status: DC
  Administered 2017-05-31 (×3): 3 10*6.[IU] via INTRAVENOUS
  Filled 2017-05-31 (×7): qty 50

## 2017-05-31 MED ORDER — LACTATED RINGERS IV SOLN
500.0000 mL | Freq: Once | INTRAVENOUS | Status: AC
  Administered 2017-05-31: 500 mL via INTRAVENOUS

## 2017-05-31 MED ORDER — DIPHENHYDRAMINE HCL 50 MG/ML IJ SOLN: 12.5000 mg | INTRAMUSCULAR | Status: DC | PRN

## 2017-05-31 MED ORDER — LIDOCAINE HCL (PF) 1 % IJ SOLN
INTRAMUSCULAR | Status: DC | PRN
  Administered 2017-05-31 (×2): 5 mL
  Administered 2017-06-01: 5 mL via EPIDURAL
  Administered 2017-06-01: 5 mL

## 2017-05-31 MED ORDER — FENTANYL 2.5 MCG/ML BUPIVACAINE 1/10 % EPIDURAL INFUSION (WH - ANES)
14.0000 mL/h | INTRAMUSCULAR | Status: DC | PRN
  Administered 2017-05-31 – 2017-06-01 (×3): 14 mL/h via EPIDURAL
  Filled 2017-05-31 (×3): qty 100

## 2017-05-31 NOTE — Progress Notes (Signed)
5-6/C/-1/+caput FHT cat one

## 2017-05-31 NOTE — Anesthesia Pain Management Evaluation Note (Signed)
  CRNA Pain Management Visit Note  Patient: Lynn Bell, 31 y.o., female  "Hello I am a member of the anesthesia team at [facility]. We have an anesthesia team available at all times to provide care throughout the hospital, including epidural management and anesthesia for C-section. I don't know your plan for the delivery whether it a natural birth, water birth, IV sedation, nitrous supplementation, doula or epidural, but we want to meet your pain goals."   1.Was your pain managed to your expectations on prior hospitalizations?   No prior hospitalizations  2.What is your expectation for pain management during this hospitalization?     Epidural  3.How can we help you reach that goal? epidural  Record the patient's initial score and the patient's pain goal.   Pain: 0  Pain Goal: 4 [facility] wants you to be able to say your pain was always managed very well.  Dezi Brauner 05/31/2017

## 2017-05-31 NOTE — Progress Notes (Signed)
No HA or vision change, no epigastric pain  Vitals:   05/30/17 2026 05/31/17 0125  BP: (!) 145/82 135/82  Pulse: 84 73  Resp: 18 18  Temp: 98.7 F (37.1 C) 99 F (37.2 C)   Lungs CTA Abdomen no epigastric tenderness DTR 2+  FHT cat one UCs irregular, mild  S/P Cytotec at 5:15 am today  A/P: D/W patient and husband two stage induction         Check labs         Continue Cytotec

## 2017-05-31 NOTE — Progress Notes (Signed)
Feeling some UCs  Pitocin on 4.5 cc/hr Cx FT/80/-2/vtx FHT cat one UCs irregular Vitals:   05/31/17 1138 05/31/17 1239  BP: (!) 149/93 135/84  Pulse: 88 89  Resp:  18  Temp:  98.4 F (36.9 C)   Labs OK  A/P: Continue Pitocin         D/W patient

## 2017-05-31 NOTE — Anesthesia Procedure Notes (Signed)
Epidural Patient location during procedure: OB  Staffing Anesthesiologist: Tereza Gilham Performed: anesthesiologist   Preanesthetic Checklist Completed: patient identified, site marked, surgical consent, pre-op evaluation, timeout performed, IV checked, risks and benefits discussed and monitors and equipment checked  Epidural Patient position: sitting Prep: DuraPrep Patient monitoring: heart rate, continuous pulse ox and blood pressure Approach: right paramedian Location: L3-L4 Injection technique: LOR saline  Needle:  Needle type: Tuohy  Needle gauge: 17 G Needle length: 9 cm and 9 Needle insertion depth: 6 cm Catheter type: closed end flexible Catheter size: 20 Guage Catheter at skin depth: 10 cm Test dose: negative  Assessment Events: blood not aspirated, injection not painful, no injection resistance, negative IV test and no paresthesia  Additional Notes Patient identified. Risks/Benefits/Options discussed with patient including but not limited to bleeding, infection, nerve damage, paralysis, failed block, incomplete pain control, headache, blood pressure changes, nausea, vomiting, reactions to medication both or allergic, itching and postpartum back pain. Confirmed with bedside nurse the patient's most recent platelet count. Confirmed with patient that they are not currently taking any anticoagulation, have any bleeding history or any family history of bleeding disorders. Patient expressed understanding and wished to proceed. All questions were answered. Sterile technique was used throughout the entire procedure. Please see nursing notes for vital signs. Test dose was given through epidural needle and negative prior to continuing to dose epidural or start infusion. Warning signs of high block given to the patient including shortness of breath, tingling/numbness in hands, complete motor block, or any concerning symptoms with instructions to call for help. Patient was given  instructions on fall risk and not to get out of bed. All questions and concerns addressed with instructions to call with any issues.     

## 2017-05-31 NOTE — Progress Notes (Signed)
2/80/-2 AROM clear FHT cat one

## 2017-05-31 NOTE — Anesthesia Preprocedure Evaluation (Addendum)
Anesthesia Evaluation  Patient identified by MRN, date of birth, ID band Patient awake    Reviewed: Allergy & Precautions, H&P , NPO status , Patient's Chart, lab work & pertinent test results  History of Anesthesia Complications Negative for: history of anesthetic complications  Airway Mallampati: II  TM Distance: >3 FB Neck ROM: full    Dental no notable dental hx. (+) Teeth Intact   Pulmonary former smoker,    Pulmonary exam normal breath sounds clear to auscultation       Cardiovascular negative cardio ROS Normal cardiovascular exam Rhythm:regular Rate:Normal     Neuro/Psych negative neurological ROS  negative psych ROS   GI/Hepatic negative GI ROS, Neg liver ROS,   Endo/Other  negative endocrine ROS  Renal/GU negative Renal ROS  negative genitourinary   Musculoskeletal   Abdominal   Peds  Hematology negative hematology ROS (+)   Anesthesia Other Findings   Reproductive/Obstetrics (+) Pregnancy                             Anesthesia Physical Anesthesia Plan  ASA: II and emergent  Anesthesia Plan: Epidural   Post-op Pain Management:    Induction:   PONV Risk Score and Plan: 4 or greater and Ondansetron, Dexamethasone, Midazolam, Scopolamine patch - Pre-op and Propofol infusion  Airway Management Planned: Natural Airway  Additional Equipment:   Intra-op Plan: Utilization of Controlled Hypotension per surrgeon request  Post-operative Plan:   Informed Consent: I have reviewed the patients History and Physical, chart, labs and discussed the procedure including the risks, benefits and alternatives for the proposed anesthesia with the patient or authorized representative who has indicated his/her understanding and acceptance.   Dental advisory given  Plan Discussed with: CRNA, Anesthesiologist and Surgeon  Anesthesia Plan Comments: (C/Section for arrest of descent. Will  use epidural for C/Section. M. Royce Macadamia, MD)      Anesthesia Quick Evaluation

## 2017-05-31 NOTE — Progress Notes (Signed)
3/C/0 IUPC placed  FHT cat one, good response to scalp stim

## 2017-06-01 ENCOUNTER — Encounter (HOSPITAL_COMMUNITY): Payer: Self-pay | Admitting: *Deleted

## 2017-06-01 ENCOUNTER — Encounter (HOSPITAL_COMMUNITY)
Admission: AD | Disposition: A | Discharge status: Home / Self Care | Payer: Self-pay | Source: Ambulatory Visit | Attending: Obstetrics and Gynecology

## 2017-06-01 SURGERY — Surgical Case
Anesthesia: Epidural

## 2017-06-01 MED ORDER — DEXAMETHASONE SODIUM PHOSPHATE 4 MG/ML IJ SOLN
INTRAMUSCULAR | Status: AC
  Filled 2017-06-01: qty 1

## 2017-06-01 MED ORDER — METOCLOPRAMIDE HCL 5 MG/ML IJ SOLN: 10.0000 mg | Freq: Once | INTRAMUSCULAR | Status: DC | PRN

## 2017-06-01 MED ORDER — ONDANSETRON HCL 4 MG/2ML IJ SOLN
INTRAMUSCULAR | Status: AC
  Filled 2017-06-01: qty 4

## 2017-06-01 MED ORDER — ONDANSETRON HCL 4 MG/2ML IJ SOLN
INTRAMUSCULAR | Status: DC | PRN
  Administered 2017-06-01: 4 mg via INTRAVENOUS

## 2017-06-01 MED ORDER — INFLUENZA VAC SPLIT QUAD 0.5 ML IM SUSY
0.5000 mL | PREFILLED_SYRINGE | INTRAMUSCULAR | Status: AC
  Administered 2017-06-02: 0.5 mL via INTRAMUSCULAR
  Filled 2017-06-01: qty 0.5

## 2017-06-01 MED ORDER — SCOPOLAMINE 1 MG/3DAYS TD PT72
MEDICATED_PATCH | TRANSDERMAL | Status: AC
  Filled 2017-06-01: qty 1

## 2017-06-01 MED ORDER — MEPERIDINE HCL 25 MG/ML IJ SOLN
INTRAMUSCULAR | Status: DC | PRN
  Administered 2017-06-01 (×2): 12.5 mg via INTRAVENOUS

## 2017-06-01 MED ORDER — BUPROPION HCL ER (XL) 150 MG PO TB24
150.0000 mg | ORAL_TABLET | Freq: Every day | ORAL | Status: DC
  Administered 2017-06-01 – 2017-06-04 (×4): 150 mg via ORAL
  Filled 2017-06-01 (×4): qty 1

## 2017-06-01 MED ORDER — SIMETHICONE 80 MG PO CHEW
80.0000 mg | CHEWABLE_TABLET | ORAL | Status: DC
  Administered 2017-06-02 – 2017-06-03 (×3): 80 mg via ORAL
  Filled 2017-06-01 (×3): qty 1

## 2017-06-01 MED ORDER — DIBUCAINE 1 % RE OINT: 1.0000 "application " | TOPICAL_OINTMENT | RECTAL | Status: DC | PRN

## 2017-06-01 MED ORDER — SCOPOLAMINE 1 MG/3DAYS TD PT72
MEDICATED_PATCH | TRANSDERMAL | Status: DC | PRN
  Administered 2017-06-01: 1 via TRANSDERMAL

## 2017-06-01 MED ORDER — PRENATAL MULTIVITAMIN CH
1.0000 | ORAL_TABLET | Freq: Every day | ORAL | Status: DC
  Administered 2017-06-01 – 2017-06-03 (×3): 1 via ORAL
  Filled 2017-06-01 (×3): qty 1

## 2017-06-01 MED ORDER — CEFAZOLIN SODIUM-DEXTROSE 2-4 GM/100ML-% IV SOLN
INTRAVENOUS | Status: AC
  Filled 2017-06-01: qty 100

## 2017-06-01 MED ORDER — KETOROLAC TROMETHAMINE 30 MG/ML IJ SOLN
30.0000 mg | Freq: Four times a day (QID) | INTRAMUSCULAR | Status: AC | PRN
  Administered 2017-06-01: 30 mg via INTRAMUSCULAR

## 2017-06-01 MED ORDER — OXYTOCIN 40 UNITS IN LACTATED RINGERS INFUSION - SIMPLE MED: 2.5000 [IU]/h | INTRAVENOUS | Status: AC

## 2017-06-01 MED ORDER — OXYCODONE HCL 5 MG PO TABS
5.0000 mg | ORAL_TABLET | ORAL | Status: DC | PRN
  Administered 2017-06-03: 5 mg via ORAL
  Filled 2017-06-01: qty 1

## 2017-06-01 MED ORDER — LACTATED RINGERS IV SOLN
INTRAVENOUS | Status: DC
  Administered 2017-06-01 (×2): via INTRAVENOUS

## 2017-06-01 MED ORDER — SIMETHICONE 80 MG PO CHEW
80.0000 mg | CHEWABLE_TABLET | Freq: Three times a day (TID) | ORAL | Status: DC
  Administered 2017-06-01 – 2017-06-04 (×10): 80 mg via ORAL
  Filled 2017-06-01 (×10): qty 1

## 2017-06-01 MED ORDER — KETOROLAC TROMETHAMINE 30 MG/ML IJ SOLN: 30.0000 mg | Freq: Four times a day (QID) | INTRAMUSCULAR | Status: AC | PRN

## 2017-06-01 MED ORDER — OXYTOCIN 10 UNIT/ML IJ SOLN
INTRAVENOUS | Status: DC | PRN
  Administered 2017-06-01: 40 [IU] via INTRAVENOUS
  Administered 2017-06-01: 05:00:00 via INTRAVENOUS

## 2017-06-01 MED ORDER — CEFAZOLIN SODIUM-DEXTROSE 2-3 GM-% IV SOLR
INTRAVENOUS | Status: DC | PRN
  Administered 2017-06-01: 2 g via INTRAVENOUS

## 2017-06-01 MED ORDER — MENTHOL 3 MG MT LOZG
1.0000 | LOZENGE | OROMUCOSAL | Status: DC | PRN
  Filled 2017-06-01: qty 9

## 2017-06-01 MED ORDER — WITCH HAZEL-GLYCERIN EX PADS: 1.0000 "application " | MEDICATED_PAD | CUTANEOUS | Status: DC | PRN

## 2017-06-01 MED ORDER — OXYCODONE HCL 5 MG PO TABS: 10.0000 mg | ORAL_TABLET | ORAL | Status: DC | PRN

## 2017-06-01 MED ORDER — MEPERIDINE HCL 25 MG/ML IJ SOLN
INTRAMUSCULAR | Status: AC
  Filled 2017-06-01: qty 1

## 2017-06-01 MED ORDER — SIMETHICONE 80 MG PO CHEW: 80.0000 mg | CHEWABLE_TABLET | ORAL | Status: DC | PRN

## 2017-06-01 MED ORDER — SENNOSIDES-DOCUSATE SODIUM 8.6-50 MG PO TABS
2.0000 | ORAL_TABLET | ORAL | Status: DC
  Administered 2017-06-02 – 2017-06-03 (×3): 2 via ORAL
  Filled 2017-06-01 (×3): qty 2

## 2017-06-01 MED ORDER — MORPHINE SULFATE (PF) 0.5 MG/ML IJ SOLN
INTRAMUSCULAR | Status: DC | PRN
  Administered 2017-06-01: 4 mg via EPIDURAL

## 2017-06-01 MED ORDER — ZOLPIDEM TARTRATE 5 MG PO TABS: 5.0000 mg | ORAL_TABLET | Freq: Every evening | ORAL | Status: DC | PRN

## 2017-06-01 MED ORDER — DEXAMETHASONE SODIUM PHOSPHATE 4 MG/ML IJ SOLN
INTRAMUSCULAR | Status: DC | PRN
  Administered 2017-06-01: 4 mg via INTRAVENOUS

## 2017-06-01 MED ORDER — FENTANYL CITRATE (PF) 100 MCG/2ML IJ SOLN
25.0000 ug | INTRAMUSCULAR | Status: DC | PRN
  Administered 2017-06-01: 50 ug via INTRAVENOUS

## 2017-06-01 MED ORDER — TETANUS-DIPHTH-ACELL PERTUSSIS 5-2.5-18.5 LF-MCG/0.5 IM SUSP: 0.5000 mL | Freq: Once | INTRAMUSCULAR | Status: DC

## 2017-06-01 MED ORDER — COCONUT OIL OIL
1.0000 "application " | TOPICAL_OIL | Status: DC | PRN
  Administered 2017-06-03: 1 via TOPICAL
  Filled 2017-06-01: qty 120

## 2017-06-01 MED ORDER — LIDOCAINE-EPINEPHRINE (PF) 2 %-1:200000 IJ SOLN
INTRAMUSCULAR | Status: AC
  Filled 2017-06-01: qty 20

## 2017-06-01 MED ORDER — KETOROLAC TROMETHAMINE 30 MG/ML IJ SOLN
INTRAMUSCULAR | Status: AC
  Filled 2017-06-01: qty 1

## 2017-06-01 MED ORDER — IBUPROFEN 600 MG PO TABS
600.0000 mg | ORAL_TABLET | Freq: Four times a day (QID) | ORAL | Status: DC
  Administered 2017-06-01 – 2017-06-04 (×12): 600 mg via ORAL
  Filled 2017-06-01 (×12): qty 1

## 2017-06-01 MED ORDER — SODIUM BICARBONATE 8.4 % IV SOLN
INTRAVENOUS | Status: AC
  Filled 2017-06-01: qty 50

## 2017-06-01 MED ORDER — OXYTOCIN 10 UNIT/ML IJ SOLN
INTRAMUSCULAR | Status: AC
  Filled 2017-06-01: qty 4

## 2017-06-01 MED ORDER — MORPHINE SULFATE (PF) 0.5 MG/ML IJ SOLN
INTRAMUSCULAR | Status: AC
  Filled 2017-06-01: qty 10

## 2017-06-01 MED ORDER — DIPHENHYDRAMINE HCL 25 MG PO CAPS: 25.0000 mg | ORAL_CAPSULE | Freq: Four times a day (QID) | ORAL | Status: DC | PRN

## 2017-06-01 MED ORDER — SODIUM CHLORIDE 0.9 % IR SOLN
Status: DC | PRN
  Administered 2017-06-01: 1

## 2017-06-01 MED ORDER — FENTANYL CITRATE (PF) 100 MCG/2ML IJ SOLN
INTRAMUSCULAR | Status: AC
  Filled 2017-06-01: qty 2

## 2017-06-01 MED ORDER — ACETAMINOPHEN 325 MG PO TABS
650.0000 mg | ORAL_TABLET | ORAL | Status: DC | PRN
  Administered 2017-06-01: 650 mg via ORAL
  Filled 2017-06-01 (×2): qty 2

## 2017-06-01 MED ORDER — MEPERIDINE HCL 25 MG/ML IJ SOLN: 6.2500 mg | INTRAMUSCULAR | Status: DC | PRN

## 2017-06-01 SURGICAL SUPPLY — 38 items
ADH SKN CLS APL DERMABOND .7 (GAUZE/BANDAGES/DRESSINGS)
APL SKNCLS STERI-STRIP NONHPOA (GAUZE/BANDAGES/DRESSINGS) ×1
BENZOIN TINCTURE PRP APPL 2/3 (GAUZE/BANDAGES/DRESSINGS) ×1 IMPLANT
CHLORAPREP W/TINT 26ML (MISCELLANEOUS) ×2 IMPLANT
CLAMP CORD UMBIL (MISCELLANEOUS) IMPLANT
CLOSURE STERI STRIP 1/2 X4 (GAUZE/BANDAGES/DRESSINGS) ×1 IMPLANT
CLOTH BEACON ORANGE TIMEOUT ST (SAFETY) ×2 IMPLANT
CONTAINER PREFILL 10% NBF 15ML (MISCELLANEOUS) IMPLANT
DERMABOND ADVANCED (GAUZE/BANDAGES/DRESSINGS)
DERMABOND ADVANCED .7 DNX12 (GAUZE/BANDAGES/DRESSINGS) IMPLANT
DRSG OPSITE POSTOP 4X10 (GAUZE/BANDAGES/DRESSINGS) ×2 IMPLANT
ELECT REM PT RETURN 9FT ADLT (ELECTROSURGICAL) ×2
ELECTRODE REM PT RTRN 9FT ADLT (ELECTROSURGICAL) ×1 IMPLANT
EXTRACTOR VACUUM M CUP 4 TUBE (SUCTIONS) IMPLANT
GLOVE BIO SURGEON STRL SZ7.5 (GLOVE) ×2 IMPLANT
GLOVE BIOGEL PI IND STRL 7.0 (GLOVE) ×1 IMPLANT
GLOVE BIOGEL PI INDICATOR 7.0 (GLOVE) ×1
GOWN STRL REUS W/TWL LRG LVL3 (GOWN DISPOSABLE) ×4 IMPLANT
KIT ABG SYR 3ML LUER SLIP (SYRINGE) ×2 IMPLANT
NDL HYPO 25X5/8 SAFETYGLIDE (NEEDLE) ×1 IMPLANT
NEEDLE HYPO 25X5/8 SAFETYGLIDE (NEEDLE) ×2 IMPLANT
NS IRRIG 1000ML POUR BTL (IV SOLUTION) ×2 IMPLANT
PACK C SECTION WH (CUSTOM PROCEDURE TRAY) ×2 IMPLANT
PAD ABD 7.5X8 STRL (GAUZE/BANDAGES/DRESSINGS) ×1 IMPLANT
PAD OB MATERNITY 4.3X12.25 (PERSONAL CARE ITEMS) ×2 IMPLANT
PENCIL SMOKE EVAC W/HOLSTER (ELECTROSURGICAL) ×2 IMPLANT
SPONGE GAUZE 4X4 12PLY STER LF (GAUZE/BANDAGES/DRESSINGS) ×2 IMPLANT
STRIP CLOSURE SKIN 1/2X4 (GAUZE/BANDAGES/DRESSINGS) IMPLANT
SUT MNCRL 0 VIOLET CTX 36 (SUTURE) ×4 IMPLANT
SUT MONOCRYL 0 CTX 36 (SUTURE) ×4
SUT PDS AB 0 CTX 60 (SUTURE) ×2 IMPLANT
SUT PLAIN 0 NONE (SUTURE) IMPLANT
SUT PLAIN 2 0 (SUTURE)
SUT PLAIN 2 0 XLH (SUTURE) IMPLANT
SUT PLAIN ABS 2-0 CT1 27XMFL (SUTURE) IMPLANT
SUT VIC AB 4-0 KS 27 (SUTURE) ×2 IMPLANT
TOWEL OR 17X24 6PK STRL BLUE (TOWEL DISPOSABLE) ×2 IMPLANT
TRAY FOLEY BAG SILVER LF 14FR (SET/KITS/TRAYS/PACK) ×2 IMPLANT

## 2017-06-01 NOTE — Addendum Note (Signed)
Addendum  created 06/01/17 1246 by Raenette Rover, CRNA   Sign clinical note

## 2017-06-01 NOTE — Progress Notes (Signed)
Patient doing well.  No complaints. BP 127/66 (BP Location: Right Arm)   Pulse 63   Temp 98.4 F (36.9 C) (Oral)   Resp 16   Ht 5' 3.5" (1.613 m)   Wt 88.5 kg (195 lb)   SpO2 97%   Breastfeeding? Unknown   BMI 34.00 kg/m  Abdomen is soft and and non tender Bandage clean and dry  POD # 0  Doing well Routine care circ tomorrow - discussed with patient

## 2017-06-01 NOTE — Anesthesia Postprocedure Evaluation (Signed)
Anesthesia Post Note  Patient: Lynn Bell  Procedure(s) Performed: Procedure(s) (LRB): CESAREAN SECTION (N/A)     Patient location during evaluation: Mother Baby Anesthesia Type: Epidural Level of consciousness: awake, awake and alert, oriented and patient cooperative Pain management: pain level controlled Vital Signs Assessment: post-procedure vital signs reviewed and stable Respiratory status: spontaneous breathing, nonlabored ventilation and respiratory function stable Cardiovascular status: stable Postop Assessment: no headache, no backache, no apparent nausea or vomiting and patient able to bend at knees Anesthetic complications: no    Last Vitals:  Vitals:   06/01/17 0940 06/01/17 1040  BP: 120/64 119/66  Pulse: 69 (!) 57  Resp: 16 16  Temp: 36.8 C 36.4 C  SpO2: 98% 97%    Last Pain:  Vitals:   06/01/17 1040  TempSrc: Axillary  PainSc: 4    Pain Goal:                 [name] L

## 2017-06-01 NOTE — Anesthesia Postprocedure Evaluation (Signed)
Anesthesia Post Note  Patient: Lynn Bell  Procedure(s) Performed: Procedure(s) (LRB): CESAREAN SECTION (N/A)     Patient location during evaluation: PACU Anesthesia Type: Epidural Level of consciousness: awake and alert and oriented Pain management: pain level controlled Vital Signs Assessment: post-procedure vital signs reviewed and stable Respiratory status: spontaneous breathing and nonlabored ventilation Cardiovascular status: blood pressure returned to baseline and stable Postop Assessment: no headache, no backache, epidural receding, patient able to bend at knees and no apparent nausea or vomiting Anesthetic complications: no    Last Vitals:  Vitals:   06/01/17 0620 06/01/17 0630  BP:  (!) 143/74  Pulse: 92 89  Resp: 17 15  Temp:    SpO2: 98% 98%    Last Pain:  Vitals:   06/01/17 0000  TempSrc: Axillary  PainSc:    Pain Goal:                 Muhammad Vacca A.

## 2017-06-01 NOTE — Lactation Note (Addendum)
This note was copied from a baby's chart. Lactation Consultation Note  Patient Name: Lynn Bell [id number] Date: 06/01/2017 Reason for consult: Initial assessment;1st time breastfeeding;Primapara;Mother's request;Term   Initial assessment with first time mom of 68 hour old infant at [facility] request. Mom was attempting to latch infant to the left breast and infant noted to be very sleepy. Attempted awakening techniques without success. Showed mom how to hand express and a few gtts were obtained and fed to infant on finger. Infant with no desire to feed or suckle. Infant left STS with mom. Mom was able to return hand expression demo.   Enc mom to keep infant STS and to feed infant STS 8-12 x in 24 hours at first feeding cues. Enc mom to call out for assistance as needed. Discussed normal NB feeding behavior, BF basics, stomach size, spoon feeding, cluster feeding and NB nutritional needs. Reassured mom infant behavior is normal at this time.   Mom with large semi compressible breasts and semi compressible areola with very short shaft nipples. Breast shells given with instructions for use and cleaning. Discussed reverse pressure with mom also.    BF Resources handout and [place] Brochure given, mom informed of IP/OP Services, BF Support Groups and [employer] phone #. Mom has a Medela pump at home for use. All questions/concerns answered.   Maternal Data Formula Feeding for Exclusion: No Has patient been taught Hand Expression?: Yes Does the patient have breastfeeding experience prior to this delivery?: No  Feeding Feeding Type: Breast Fed Length of feed: 1 min  LATCH Score Latch: Too sleepy or reluctant, no latch achieved, no sucking elicited.  Audible Swallowing: None  Type of Nipple: Flat  Comfort (Breast/Nipple): Soft / non-tender  Hold (Positioning): Assistance needed to correctly position infant at breast and maintain latch.  LATCH Score: 4  Interventions Interventions: Assisted with  latch;Breast feeding basics reviewed;Support pillows;Breast massage;Hand express;Reverse pressure;Shells  Lactation Tools Discussed/Used Tools: Shells Shell Type: Inverted WIC Program: No   Consult Status Consult Status: Follow-up Date: 06/02/17 Follow-up type: In-patient    Lynn Bell Lynn Bell 06/01/2017, 10:07 AM

## 2017-06-01 NOTE — Transfer of Care (Signed)
Immediate Anesthesia Transfer of Care Note  Patient: Lynn Bell  Procedure(s) Performed: Procedure(s): CESAREAN SECTION (N/A)  Patient Location: PACU  Anesthesia Type:Epidural  Level of Consciousness: awake, alert  and oriented  Airway & Oxygen Therapy: Patient Spontanous Breathing  Post-op Assessment: Report given to RN and Post -op Vital signs reviewed and stable  Post vital signs: Reviewed and stable  Last Vitals:  Vitals:   06/01/17 0400 06/01/17 0430  BP: 139/83 134/61  Pulse: (!) 123 (!) 107  Resp:    Temp:    SpO2:      Last Pain:  Vitals:   06/01/17 0000  TempSrc: Axillary  PainSc:          Complications: No apparent anesthesia complications

## 2017-06-01 NOTE — Op Note (Signed)
Lynn Bell, Lynn Bell              ACCOUNT NO.:  [id number]  MEDICAL RECORD NO.:  [id number]  LOCATION:  9163                          FACILITY:  [facility]  PHYSICIAN:  Daleen Bo. Gaetano Net, M.D. DATE OF BIRTH:  [birth year]  DATE OF PROCEDURE:  06/01/2017 DATE OF DISCHARGE:                              OPERATIVE REPORT   PREOPERATIVE DIAGNOSIS:  Arrest of descent.  POSTOPERATIVE DIAGNOSIS:  Arrest of descent.  PROCEDURE:  Low-transverse cesarean section.  SURGEON:  Daleen Bo. Gaetano Net, M.D.  ANESTHESIA:  Epidural, Yvetta Coder, MD  ESTIMATED BLOOD LOSS:  400 mL.  FINDINGS:  Viable female infant.  Apgars, arterial cord pH, birth weight pending.  INDICATIONS AND CONSENT:  This patient is a 31 year old, G1, P0, at 50- 1/7th weeks, who was admitted for induction of labor secondary to gestational hypertension.  She progresses to complete and pushing. After pushing with good effort for about 3.5 hours, the vertex remains above the 0 station.  Diagnosis of arrest of descent was made and recommendation for cesarean section was made.  Potential risks and complications were reviewed preoperatively including, but not limited to, infection, organ damage, bleeding requiring transfusion of blood products with HIV and hepatitis acquisition, DVT, PE, pneumonia.  The patient states she understands and agrees and consent was signed on the chart.  DESCRIPTION OF PROCEDURE:  The patient was taken to the operating room where she was identified and her epidural was augmented to a surgical level.  She was placed in a dorsal supine position with a 15-degree left lateral wedge.  Foley catheter was then placed.  She was prepped vaginally and then prepped abdominally.  After a 3-minute drying time, she was draped in a sterile fashion.  Time-out was undertaken.  After testing for adequate epidural anesthesia, skin was entered through a Pfannenstiel incision and dissection was carried out in layers to  the peritoneum.  Peritoneum was incised and extended superiorly and inferiorly.  Vesicouterine peritoneum was taken down cephalad laterally. Bladder flap developed and the bladder blade was placed.  Uterus was then incised in a low transverse manner and the uterine cavity was entered bluntly with a hemostat.  Uterine incision was extended cephalad laterally with fingers.  Baby was delivered from the direct OP position and the vertex was delivered without difficulty.  Baby was delivered and good cry and tone was noted.  Cord was clamped and cut and the baby was handed to awaiting Pediatrics team after 1 minute of observation time. Placenta was then manually delivered and sent to Pathology.  Uterus was clean.  Uterus was closed in 2 running locking imbricating layers of 0 Monocryl suture which achieves good hemostasis.  Uterus was exteriorized for close inspection and good hemostasis was noted all around.  The adnexa were normal.  Uterus was then returned and lavage was carried out.  Anterior peritoneum was closed in a running fashion with 0 Monocryl suture which was also used to reapproximate the pyramidalis muscle in the midline.  Anterior rectus fascia was closed in a running fashion with a 0 looped PDS. Subcutaneous layers closed with interrupted plain and the skin was closed in a subcuticular fashion with 4-0 Vicryl on a Lanny Hurst  needle. Benzoin, Steri-Strips, pressure dressing were applied.  The patient was taken to the recovery room in stable condition.     Daleen Bo Gaetano Net, M.D.     JET/MEDQ  D:  06/01/2017  T:  06/01/2017  Job:  [id number]

## 2017-06-01 NOTE — Consult Note (Addendum)
Neonatology Note:   Attendance at C-section:    I was asked by Dr. Gaetano Net to attend this primary C/S at term after FTP. The mother is a G1, GBS positive with aIAP and with good prenatal care. ROM 13 hours before delivery, fluid clear. Infant vigorous with good spontaneous cry and tone. +60 sec DCC.  Needed only minimal bulb suctioning. Ap 8/9. Lungs clear to ausc in DR. To CN to care of Pediatrician.  Monia Sabal Katherina Mires, MD

## 2017-06-01 NOTE — Brief Op Note (Signed)
05/30/2017 - 06/01/2017  5:56 AM  PATIENT:  Lynn Bell  31 y.o. female  PRE-OPERATIVE DIAGNOSIS:  Failure to descend  POST-OPERATIVE DIAGNOSIS:  Failure to descend  PROCEDURE:  Procedure(s): CESAREAN SECTION (N/A)  SURGEON:  Surgeon(s) and Role:    * Everlene Farrier, MD - Primary  PHYSICIAN ASSISTANT:   ASSISTANTS: none   ANESTHESIA:   epidural  EBL:  Total I/O In: 1000 [I.V.:1000] Out: 250 [Blood:250]  BLOOD ADMINISTERED:none  DRAINS: Urinary Catheter (Foley)   LOCAL MEDICATIONS USED:  NONE  SPECIMEN:  Source of Specimen:  placenta  DISPOSITION OF SPECIMEN:  PATHOLOGY  COUNTS:  YES  TOURNIQUET:  * No tourniquets in log *  DICTATION: .Other Dictation: Dictation Number [id number]  PLAN OF CARE: Admit to inpatient   PATIENT DISPOSITION:  PACU - hemodynamically stable.   Delay start of Pharmacological VTE agent (>24hrs) due to surgical blood loss or risk of bleeding: not applicable

## 2017-06-01 NOTE — Progress Notes (Signed)
Second stage for 3.5 hours Pushing with good effort Vertex above zero station-+moulding/caput FHT reactive with variable shaped decels with pushes A/P: arrest of descent. Rec: C/S. D/W risks including infection, organ damage, bleeding/transfusion-HIV/Hep, DVT/PE, pneumonia. Patient states she understands and agrees.

## 2017-06-02 LAB — CBC
HCT: 25.1 % — ABNORMAL LOW (ref 36.0–46.0)
Hemoglobin: 8.4 g/dL — ABNORMAL LOW (ref 12.0–15.0)
MCH: 29.8 pg (ref 26.0–34.0)
MCHC: 33.5 g/dL (ref 30.0–36.0)
MCV: 89 fL (ref 78.0–100.0)
PLATELETS: 160 10*3/uL (ref 150–400)
RBC: 2.82 MIL/uL — ABNORMAL LOW (ref 3.87–5.11)
RDW: 14.5 % (ref 11.5–15.5)
WBC: 12.7 10*3/uL — AB (ref 4.0–10.5)

## 2017-06-02 NOTE — Progress Notes (Signed)
Subjective: Postpartum Day 1: Cesarean Delivery Patient reports tolerating PO.    Objective: Vital signs in last 24 hours: Temp:  [97.6 F (36.4 C)-98.4 F (36.9 C)] 98.4 F (36.9 C) (09/18 2141) Pulse Rate:  [57-84] 74 (09/19 0124) Resp:  [16-18] 16 (09/19 0124) BP: (106-128)/(50-77) 118/69 (09/19 0124) SpO2:  [95 %-100 %] 100 % (09/19 0124)  Physical Exam:  General: alert Lochia: appropriate Uterine Fundus: firm Incision: healing well DVT Evaluation: No evidence of DVT seen on physical exam.   Recent Labs  05/31/17 1607 06/02/17 0539  HGB 11.8* 8.4*  HCT 34.8* 25.1*    Assessment/Plan: Status post Cesarean section. Doing well postoperatively.  Continue current care.  Lynn Bell 06/02/2017, 9:55 AM

## 2017-06-02 NOTE — Progress Notes (Addendum)
MOB was referred for history of depression/anxiety. * Referral screened out by Clinical Social Worker because none of the following criteria appear to apply: ~ History of anxiety/depression during this pregnancy, or of post-partum depression. ~ Diagnosis of anxiety and/or depression within last 3 years OR * MOB's symptoms currently being treated with medication and/or therapy. Please contact the Clinical Social Worker if needs arise, by [facility] request, or if MOB scores greater than 9/yes to question 10 on [place] Postpartum Depression Screen.  MOB has Rx for Wellbutrin.  MOB scored 9 on [place] Postpartum Depression Screen.  Bedside RN states no current concerns.

## 2017-06-03 NOTE — Lactation Note (Signed)
This note was copied from a baby's chart. Lactation Consultation Note  Patient Name: Lynn Bell [id number] Date: 06/03/2017 Reason for consult: Follow-up assessment;Nipple pain/trauma;Difficult latch;Infant weight loss    Called to room for feeding assistance for 1st time mom of 72 hour old infant. Mom had infant latched to left breast in the football hold with the # 16 NS in place. Infant feeding actively with swallows noted. Colostrum was noted in NS when infant came off. Enc mom to put gtts colostrum into infant mouth via finger. Infant asleep post feeding.   Worked with mom on hand expression, mom has difficulty hand expressing due to large breasts. Mom pumped using # 21 flanges and pumped 15 ml colostrum. Mom was pleased to see colostrum. Mom is noted to have some knots to the breasts, enc mom to massage breasts before and during pumping. Reviewed engorgement prevention/treatment.   Discussed importance of feeding infant all EBM that is obtained via curved tip syringe. Mom reports she was shown how syringe feed infant. Mom reports she was told to feed infant every hour. I/O updated in computer per mom's feeding log. Infant with 7 BF for 10-20 minutes, 5 BF attempts for < 10 minutes each, 3 voids and 1 stool in last 24 hours. She has supplemented infant with EBM as it is available for up to 7 ml. Mom is using a # 16 NS with each feeding. LATCH scores 3-6.  Infant weight 7 lb 6 oz with 8% weight loss since birth.   Mom is exhausted and was encouraged to take a nap as family is in the room to care for infant. Enc mom to feed infant 8-12 x in 24 hours at first feeding cues. Enc mom to BF first and follow with supplementation of EBM via curved tip syringe/finger and then pump for 15 minutes with DEBP, followed by hand expression. Enc mom to rest between feedings.   Mom feels the plan is doable and feels that infant is feeding better. Enc mom to call out for feeding assistance as needed. Mom  without further questions/concerns at this time. Report to Stana Bunting, RN.    Maternal Data Formula Feeding for Exclusion: No Has patient been taught Hand Expression?: Yes (difficult for mom to do as she has large breasts) Does the patient have breastfeeding experience prior to this delivery?: No  Feeding Feeding Type: Breast Fed Length of feed: 15 min  LATCH Score Latch: Grasps breast easily, tongue down, lips flanged, rhythmical sucking.  Audible Swallowing: A few with stimulation  Type of Nipple: Everted at rest and after stimulation  Comfort (Breast/Nipple): Filling, red/small blisters or bruises, mild/mod discomfort  Hold (Positioning): No assistance needed to correctly position infant at breast.  LATCH Score: 8  Interventions Interventions: Expressed milk;Coconut oil;Hand express  Lactation Tools Discussed/Used Tools: Pump;Coconut oil Nipple shield size: 16 Flange Size: 21 Shell Type: Inverted Breast pump type: Double-Electric Breast Pump Pump Review: Setup, frequency, and cleaning;Milk Storage Initiated by:: Reviewed and encouraged every 2-3 hours   Consult Status Consult Status: Follow-up Date: 06/04/17 Follow-up type: In-patient    Debby Freiberg Hice 06/03/2017, 4:16 PM

## 2017-06-03 NOTE — Lactation Note (Addendum)
This note was copied from a baby's chart. Lactation Consultation Note Baby is poor feeder. Has short feeding. Crying, acts hungry. Good output. Mom has large breast, flat nipples. Has #16 NS.  Assisted Baby to latched onto breast in football position. Taught "C" hold and proper NS application. Baby would suckle a few minutes then go to sleep, wake up agitated hungry. Stressed importance to mom to firm up breast. While baby BF to Lt. Breast at intervals, LC hand expressing Rt. Breast collected 2 ml colostrum, dropped vial and spilt colostrum. LC apologized sincerely. Mom stated it was ok, could see disappointment in her face. FOB looked aggravated. LC cont. To hand express colostrum to give baby in NS to stimulate to cont. To BF. Collected total of 7 ml colostrum. Gave 41ml in NS at intervals and 3 ml curve tip syring finger feeding. Instructed mom on finger feeding.  Baby needed stimulated to suckle frequently. Heard swallows. Baby BF well w/active suckling. When baby sleeping soundly came off breast, noted colostrum in NS. LC drew it out w/curve tip syring. Educated on milk storage, hand expression, pumping, supplementing w/colostrum, breast filling, cluster feeding, and importance of breast assessment before and after BF. Noted significant softening of Lt, breast after feeding. Mom pleased as well as FOB. Baby satisfied sleeping soundly.  Encouraged mom to use DEBP, Rt. Breast needed pumped. LC mainly collected colostrum from Rt. Breast, still firmer than Lt. Encouraged mom to pump both breast.  Mom and baby calmer when [place] left.    Patient Name: Lynn Bell [id number] Date: 06/03/2017 Reason for consult: Follow-up assessment;Difficult latch   Maternal Data    Feeding Feeding Type: Breast Milk Length of feed: 10 min (off and on  on breast prob 30)  LATCH Score Latch: Repeated attempts needed to sustain latch, nipple held in mouth throughout feeding, stimulation needed to elicit sucking  reflex.  Audible Swallowing: A few with stimulation  Type of Nipple: Flat  Comfort (Breast/Nipple): Soft / non-tender  Hold (Positioning): Full assist, staff holds infant at breast  LATCH Score: 5  Interventions Interventions: Breast feeding basics reviewed;Support pillows;Assisted with latch;Position options;Skin to skin;Expressed milk;Breast massage;Hand express;Shells;DEBP;Breast compression;Adjust position  Lactation Tools Discussed/Used Tools: Shells;Pump;Flanges;Nipple Shields;57F feeding tube / Syringe Nipple shield size: 16 Flange Size: 21 Shell Type: Inverted Breast pump type: Double-Electric Breast Pump Pump Review: Setup, frequency, and cleaning;Milk Storage Initiated by:: RN Date initiated:: 06/02/17   Consult Status Consult Status: Follow-up Date: 06/03/17 Follow-up type: In-patient    Theodoro Kalata 06/03/2017, 3:46 AM

## 2017-06-03 NOTE — Progress Notes (Signed)
Subjective: Postpartum Day 2: Cesarean Delivery Patient reports tolerating PO, + flatus and no problems voiding.    Objective: Vital signs in last 24 hours: Temp:  [97.6 F (36.4 C)-98.7 F (37.1 C)] 98.7 F (37.1 C) (09/20 0554) Pulse Rate:  [87-88] 87 (09/20 0554) Resp:  [16-18] 18 (09/20 0554) BP: (132-140)/(76-80) 140/80 (09/20 0554) SpO2:  [100 %] 100 % (09/19 1813)  Physical Exam:  General: alert, cooperative and appears stated age Lochia: appropriate Uterine Fundus: firm Incision: healing well, no significant drainage, no dehiscence, no significant erythema DVT Evaluation: No evidence of DVT seen on physical exam. Negative Homan's sign. No cords or calf tenderness. No significant calf/ankle edema.   Recent Labs  05/31/17 1607 06/02/17 0539  HGB 11.8* 8.4*  HCT 34.8* 25.1*    Assessment/Plan: Status post Cesarean section. Doing well postoperatively.  Continue current care. Baby poor feeder - continue lactation support.   Lynn Bell Lynn Bell 06/03/2017, 9:05 AM

## 2017-06-04 MED ORDER — IBUPROFEN 600 MG PO TABS: 600.0000 mg | ORAL_TABLET | Freq: Four times a day (QID) | ORAL | 0 refills | Status: DC

## 2017-06-04 MED ORDER — OXYCODONE-ACETAMINOPHEN 5-325 MG PO TABS: 1.0000 | ORAL_TABLET | Freq: Four times a day (QID) | ORAL | 0 refills | Status: DC | PRN

## 2017-06-04 NOTE — Discharge Summary (Signed)
Obstetric Discharge Summary Reason for Admission: induction of labor Prenatal Procedures: none Intrapartum Procedures: cesarean: low cervical, transverse Postpartum Procedures: none Complications-Operative and Postpartum: none Hemoglobin  Date Value Ref Range Status  06/02/2017 8.4 (L) 12.0 - 15.0 g/dL Final    Comment:    DELTA CHECK NOTED REPEATED TO VERIFY    HCT  Date Value Ref Range Status  06/02/2017 25.1 (L) 36.0 - 46.0 % Final    Physical Exam:  General: alert and cooperative Lochia: appropriate Uterine Fundus: firm Incision: no significant drainage DVT Evaluation: No evidence of DVT seen on physical exam.  Discharge Diagnoses: Term Pregnancy-delivered  Discharge Information: Date: 06/04/2017 Activity: pelvic rest Diet: routine Medications: PNV, Ibuprofen and Percocet Condition: stable Instructions: refer to practice specific booklet Discharge to: home [place], [facility] Schedule an appointment as soon as possible for a visit in 2 week(s).   Contact information: [place] [address] [phone]           Newborn Data: Live born female  Birth Weight: 8 lb 0.6 oz (3645 g) APGAR: 8, 9  Home with mother.  Lynn Bell 06/04/2017, 9:24 AM

## 2017-06-04 NOTE — Lactation Note (Signed)
This note was copied from a baby's chart. Lactation Consultation Note  Patient Name: Lynn Bell [id number] Date: 06/04/2017   Mom's milk is in. Prior to consult, she had pumped 2.5 oz. Infant was very upset w/initial latch attempt. We finger-fed him some EBM to calm him, but he was still having a difficult time calming & forming a seal at Mom's breast. Mom had been using a size 16 NS. Although it is the appropriate size for Mom's nipple, I tried a size 20 to see if a more pliable & slightly larger teat would assist newborn in latching. Some swallows were noted, but infant still upset. He was given EBM via curved-tip syringe at breast until satiated. Mom also noticed softening of that breast. B/c of the contours of Mom's breasts, the nipple shields do not always stay in place. However, Mom is aware when it moves out of place & knows to move it back.   Feeding plan provided along with # to call for post-discharge questions. Extra nipple shields provided (Mom to choose which size works best for her & [employer]). Parents pleased that there are less "rules" to follow now that Mom's milk is in. Mom does have a Medela DEBP at home. Mom was also shown how to assemble & use hand pump that was included in pump kit.   Of note, Mom takes bupropion XL 115m qd (L3).  RMatthias HughsHMedstar Montgomery Medical Center9/21/2018, 8:39 AM

## 2017-09-24 ENCOUNTER — Ambulatory Visit: Payer: 59 | Admitting: Family Medicine

## 2017-09-24 ENCOUNTER — Encounter: Payer: Self-pay | Admitting: Family Medicine

## 2017-09-24 ENCOUNTER — Other Ambulatory Visit: Payer: Self-pay

## 2017-09-24 VITALS — BP 121/80 | HR 75 | Temp 98.1°F | Resp 16 | Ht 64.0 in | Wt 167.0 lb

## 2017-09-24 DIAGNOSIS — L309 Dermatitis, unspecified: Secondary | ICD-10-CM | POA: Diagnosis not present

## 2017-09-24 DIAGNOSIS — F419 Anxiety disorder, unspecified: Secondary | ICD-10-CM

## 2017-09-24 MED ORDER — HYDROXYZINE HCL 50 MG PO TABS: ORAL_TABLET | ORAL | 1 refills | Status: DC

## 2017-09-24 NOTE — Assessment & Plan Note (Signed)
Deteriorated.  Pt gets very anxious when her skin flares.  Will add hydroxyzine to improve both itching and anxiety.  If this does not improve her anxiety levels, pt is to MyChart me and let me know so we can increase her Wellbutrin back to 300mg  daily.  Pt expressed understanding and is in agreement w/ plan.

## 2017-09-24 NOTE — Patient Instructions (Signed)
Follow up as needed or as scheduled START the Hydroxyzine before bed and as needed during the day for itching.  1 tab before bed, 1/2 tab during the day Continue to use the steroid cream on the hands If the anxiety continues to be high, MyChart me and let me know so we can increase the Wellbutrin Call with any questions or concerns Happy New Year!!!

## 2017-09-24 NOTE — Progress Notes (Signed)
   Subjective:    Patient ID: Lynn Bell, female    DOB: [birth year], 32 y.o.   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  During pregnancy her psoriasis was 'completely gone'.  Since delivery and restarting OCPs hands have again broken out.  Using the Halobetasol prn for spot treatment and natural remedies.  Skin breakouts increase her anxiety.   Review of Systems For ROS see HPI     Objective:   Physical Exam  Constitutional: She is oriented to person, place, and time. She appears well-developed and well-nourished. No distress.  Neurological: She is alert and oriented to person, place, and time.  Skin: Skin is warm and dry. There is erythema (hands are erythematous w/ dry, cracked areas).  Psychiatric: Her behavior is normal. Thought content normal.  anxious  Vitals reviewed.         Assessment & Plan:

## 2017-09-24 NOTE — Assessment & Plan Note (Signed)
Deteriorated.  Pt frequently struggles in winter given weather changes and her job as a Emergency planning/management officer.  She is also a new mother and washing hands frequently.  Continue to use steroid ointment prn.  Add Hydroxyzine for itching.  Will follow.

## 2018-01-21 DIAGNOSIS — L239 Allergic contact dermatitis, unspecified cause: Secondary | ICD-10-CM | POA: Diagnosis not present

## 2018-01-21 DIAGNOSIS — L400 Psoriasis vulgaris: Secondary | ICD-10-CM | POA: Diagnosis not present

## 2018-04-01 ENCOUNTER — Inpatient Hospital Stay (HOSPITAL_COMMUNITY)
Admission: AD | Admit: 2018-04-01 | Discharge: 2018-04-01 | Disposition: A | Discharge status: Home / Self Care | Payer: 59 | Source: Ambulatory Visit | Attending: Obstetrics and Gynecology | Admitting: Obstetrics and Gynecology

## 2018-04-01 ENCOUNTER — Other Ambulatory Visit: Payer: Self-pay

## 2018-04-01 ENCOUNTER — Encounter (HOSPITAL_COMMUNITY): Payer: Self-pay | Admitting: *Deleted

## 2018-04-01 ENCOUNTER — Inpatient Hospital Stay (HOSPITAL_COMMUNITY): Payer: 59

## 2018-04-01 DIAGNOSIS — Z3A01 Less than 8 weeks gestation of pregnancy: Secondary | ICD-10-CM | POA: Diagnosis not present

## 2018-04-01 DIAGNOSIS — O4690 Antepartum hemorrhage, unspecified, unspecified trimester: Secondary | ICD-10-CM | POA: Insufficient documentation

## 2018-04-01 DIAGNOSIS — O209 Hemorrhage in early pregnancy, unspecified: Secondary | ICD-10-CM | POA: Diagnosis not present

## 2018-04-01 DIAGNOSIS — O3680X0 Pregnancy with inconclusive fetal viability, not applicable or unspecified: Secondary | ICD-10-CM

## 2018-04-01 DIAGNOSIS — Z3A00 Weeks of gestation of pregnancy not specified: Secondary | ICD-10-CM | POA: Diagnosis not present

## 2018-04-01 LAB — CBC
HEMATOCRIT: 35.6 % — AB (ref 36.0–46.0)
HEMOGLOBIN: 12.1 g/dL (ref 12.0–15.0)
MCH: 30.4 pg (ref 26.0–34.0)
MCHC: 34 g/dL (ref 30.0–36.0)
MCV: 89.4 fL (ref 78.0–100.0)
Platelets: 242 10*3/uL (ref 150–400)
RBC: 3.98 MIL/uL (ref 3.87–5.11)
RDW: 13.3 % (ref 11.5–15.5)
WBC: 6.6 10*3/uL (ref 4.0–10.5)

## 2018-04-01 LAB — URINALYSIS, ROUTINE W REFLEX MICROSCOPIC
Bacteria, UA: NONE SEEN
Bilirubin Urine: NEGATIVE
GLUCOSE, UA: NEGATIVE mg/dL
Ketones, ur: NEGATIVE mg/dL
LEUKOCYTES UA: NEGATIVE
NITRITE: NEGATIVE
PROTEIN: 100 mg/dL — AB
RBC / HPF: >50 RBC/hpf — ABNORMAL HIGH (ref 0–5)
SPECIFIC GRAVITY, URINE: 1.005 (ref 1.005–1.030)
pH: 6 (ref 5.0–8.0)

## 2018-04-01 LAB — POCT PREGNANCY, URINE: Preg Test, Ur: POSITIVE — AB

## 2018-04-01 LAB — WET PREP, GENITAL
CLUE CELLS WET PREP: NONE SEEN
SPERM: NONE SEEN
Trich, Wet Prep: NONE SEEN
WBC WET PREP: NONE SEEN
Yeast Wet Prep HPF POC: NONE SEEN

## 2018-04-01 LAB — HCG, QUANTITATIVE, PREGNANCY: hCG, Beta Chain, Quant, S: 3720 m[IU]/mL — ABNORMAL HIGH (ref <5)

## 2018-04-01 NOTE — MAU Note (Signed)
Had pinkish/brown spotting.  About an hour ago, had excessive bleeding and clots (filled the toilet).  Has first appt on Monday. Is cramping.

## 2018-04-01 NOTE — Discharge Instructions (Signed)
--  You need to be seen immediately if you bleeding is heavy and soaking through a pad every hour for more than 2 consecutive hours, or severe abdominal pain not controlled with over the counter medications.   Miscarriage A miscarriage is the loss of an unborn baby (fetus) before the 20th week of pregnancy. The cause is often unknown. Follow these instructions at home:  You may need to stay in bed (bed rest), or you may be able to do light activity. Go about activity as told by your doctor.  Have help at home.  Write down how many pads you use each day. Write down how soaked they are.  Do not use tampons. Do not wash out your vagina (douche) or have sex (intercourse) until your doctor approves.  Only take medicine as told by your doctor.  Do not take aspirin.  Keep all doctor visits as told.  If you or your partner have problems with grieving, talk to your doctor. You can also try counseling. Give yourself time to grieve before trying to get pregnant again. Get help right away if:  You have bad cramps or pain in your back or belly (abdomen).  You have a fever.  You pass large clumps of blood (clots) from your vagina that are walnut-sized or larger. Save the clumps for your doctor to see.  You pass large amounts of tissue from your vagina. Save the tissue for your doctor to see.  You have more bleeding.  You have thick, bad-smelling fluid (discharge) coming from the vagina.  You get lightheaded, weak, or you pass out (faint).  You have chills. This information is not intended to replace advice given to you by your health care provider. Make sure you discuss any questions you have with your health care provider. Document Released: 11/23/2011 Document Revised: 02/06/2016 Document Reviewed: 10/01/2011 Elsevier Interactive Patient Education  2017 [employer].

## 2018-04-01 NOTE — MAU Provider Note (Signed)
History  CSN: [id number] Arrival date and time: 04/01/18 1115  First Provider Initiated Contact with Patient 04/01/18 1427      Chief Complaint  Patient presents with  . Vaginal Bleeding  . Abdominal Pain  . Possible Pregnancy    HPI: Lynn Bell is a 32 y.o. G2P1001 at [redacted]w[redacted]d by LMP (02/08/18) who presents to maternity admissions reporting vaginal bleeding and cramping. She reports first having a positive pregnancy test about 2 weeks ago, and has her first prenatal visit scheduled for Monday. She started having spotting for a few days, but then today had a large amount of blood in the toilet. She reports "it just kept pouring in the toilet", and now still having some more bleeding like a period. Does not know if she saw any POCs, but she reports seeking something that looked like mucus. Has some abdominal cramping. Denies nausea, vomiting, fever, or chills.    Past obstetric history: OB History  Gravida Para Term Preterm AB Living  2 1 1     1   SAB TAB Ectopic Multiple Live Births        0 1    # Outcome Date GA Lbr Len/2nd Weight Sex Delivery Anes PTL Lv  2 Current           1 Term 06/01/17 [redacted]w[redacted]d 08:55 / 04:10 8 lb 0.6 oz (3.645 kg) M CS-LTranv EPI  LIV    Past Medical History:  Diagnosis Date  . Anxiety   . Cardiac arrhythmia   . History of chicken pox   . UTI (lower urinary tract infection)    Past Surgical History:  Procedure Laterality Date  . CESAREAN SECTION N/A 06/01/2017   Procedure: CESAREAN SECTION;  Surgeon: Everlene Farrier, MD;  Location: [facility];  Service: Obstetrics;  Laterality: N/A;   Social History   Socioeconomic History  . Marital status: Married    Spouse name: Not on file  . Number of children: Not on file  . Years of education: Not on file  . Highest education level: Not on file  Occupational History  . Not on file  Social Needs  . Financial resource strain: Not on file  . Food insecurity:    Worry: Not on file    Inability: Not  on file  . Transportation needs:    Medical: Not on file    Non-medical: Not on file  Tobacco Use  . Smoking status: Former Smoker    Packs/day: 0.25    Types: Cigarettes  . Smokeless tobacco: Never Used  . Tobacco comment: 1 pack per week  Substance and Sexual Activity  . Alcohol use: Yes    Comment: 1 glass of wine per night  . Drug use: No  . Sexual activity: Yes  Lifestyle  . Physical activity:    Days per week: Not on file    Minutes per session: Not on file  . Stress: Not on file  Relationships  . Social connections:    Talks on phone: Not on file    Gets together: Not on file    Attends religious service: Not on file    Active member of club or organization: Not on file    Attends meetings of clubs or organizations: Not on file    Relationship status: Not on file  . Intimate partner violence:    Fear of current or ex partner: Not on file    Emotionally abused: Not on file    Physically abused: Not on  file    Forced sexual activity: Not on file  Other Topics Concern  . Not on file  Social History Narrative  . Not on file   Allergies  Allergen Reactions  . Latex     Pt "prefers" no latex, but not an allergy Sensitive skin    Medications Prior to Admission  Medication Sig Dispense Refill Last Dose  . ALPRAZolam (XANAX) 0.25 MG tablet Take 0.25 mg by mouth at bedtime as needed for anxiety.   Taking  . buPROPion (WELLBUTRIN XL) 150 MG 24 hr tablet Take 150 mg by mouth daily.   Taking  . halobetasol (ULTRAVATE) 0.05 % ointment Apply topically 2 (two) times daily. (Patient taking differently: Apply 1 application topically 2 (two) times daily as needed (dermatosis). ) 50 g 6 Taking  . hydrOXYzine (ATARAX/VISTARIL) 50 MG tablet 1/2-1 tab TID prn itching 90 tablet 1   . ibuprofen (ADVIL,MOTRIN) 600 MG tablet Take 1 tablet (600 mg total) by mouth every 6 (six) hours. 30 tablet 0 Taking  . JUNEL FE 1/20 1-20 MG-MCG tablet TK 1 T PO D  6 Taking  . NIFEdipine  (PROCARDIA-XL/ADALAT-CC/NIFEDICAL-XL) 30 MG 24 hr tablet Take 30 mg by mouth daily.   Taking    I have reviewed patient's Past Medical Hx, Surgical Hx, Family Hx, Social Hx, medications and allergies.   Review of Systems - Negative except for what is mentioned in HPI.  Physical Exam   Blood pressure 134/70, pulse 88, temperature 98.2 F (36.8 C), temperature source Oral, resp. rate 18, height 5\' 4"  (1.626 m), weight 165 lb 12 oz (75.2 kg), last menstrual period 02/08/2018, SpO2 100 %, not currently breastfeeding.  Constitutional: Well-developed, well-nourished female in no acute distress.  HENT: [place]/AT, MMM Eyes: normal conjunctivae, no scleral icterus Cardiovascular: normal rate Respiratory: normal effort GI: Abd soft, non-tender, non-distended  Pelvic: NEFG. Normal vaginal mucosa without lesions; small amount of blood in vaginal vault; cervix pink, visually open about 1 cm with small clot coming from cervical os, Cervix 1cm/long, neg CMT, uterus nontender, nonenlarged MSK: Extremities nontender, no edema Neurologic: Alert and oriented x 4. Psych: Normal mood and affect Skin: warm and dry  MAU Course/MDM:   Nursing notes and VS reviewed. Patient seen and examined, as noted above.   Cultures collected to r/o pelvic infection Labs ordered: Quant HCG, CBC She is Rh postive Ultrasound to r/o ectopic.   Results reviewed:  Results for orders placed or performed during the hospital encounter of 04/01/18  Urinalysis, Routine w reflex microscopic  Result Value Ref Range   Color, Urine RED (A) YELLOW   APPearance CLEAR CLEAR   Specific Gravity, Urine 1.005 1.005 - 1.030   pH 6.0 5.0 - 8.0   Glucose, UA NEGATIVE NEGATIVE mg/dL   Hgb urine dipstick LARGE (A) NEGATIVE   Bilirubin Urine NEGATIVE NEGATIVE   Ketones, ur NEGATIVE NEGATIVE mg/dL   Protein, ur 100 (A) NEGATIVE mg/dL   Nitrite NEGATIVE NEGATIVE   Leukocytes, UA NEGATIVE NEGATIVE   RBC / HPF >50 (H) 0 - 5 RBC/hpf   WBC,  UA 0-5 0 - 5 WBC/hpf   Bacteria, UA NONE SEEN NONE SEEN   Squamous Epithelial / LPF 0-5 0 - 5  CBC  Result Value Ref Range   WBC 6.6 4.0 - 10.5 K/uL   RBC 3.98 3.87 - 5.11 MIL/uL   Hemoglobin 12.1 12.0 - 15.0 g/dL   HCT 35.6 (L) 36.0 - 46.0 %   MCV 89.4 78.0 - 100.0  fL   MCH 30.4 26.0 - 34.0 pg   MCHC 34.0 30.0 - 36.0 g/dL   RDW 13.3 11.5 - 15.5 %   Platelets 242 150 - 400 K/uL  hCG, quantitative, pregnancy  Result Value Ref Range   hCG, Beta Chain, Quant, S 3,720 (H) <5 mIU/mL  Pregnancy, urine POC  Result Value Ref Range   Preg Test, Ur POSITIVE (A) NEGATIVE    US OB Comp Less 14 Wks CLINICAL DATA:  Pregnant, bleeding for 1 day, cramping, passing clots; quantitative beta HCG 3720  EXAM: OBSTETRIC <14 WK [place] AND TRANSVAGINAL OB US  TECHNIQUE: Both transabdominal and transvaginal ultrasound examinations were performed for complete evaluation of the gestation as well as the maternal uterus, adnexal regions, and pelvic cul-de-sac. Transvaginal technique was performed to assess early pregnancy.  COMPARISON:  None.  FINDINGS: Intrauterine gestational sac: None visualized  Yolk sac:  N/A  Embryo:  N/A  Cardiac Activity: N/A  Heart Rate: N/A  bpm  Subchorionic hemorrhage:  N/A  Maternal uterus/adnexae:  Heterogeneous appearance of the endometrial canal question blood.  No gestational sac or products of conception identified.  Uterus otherwise unremarkable.  LEFT ovary normal size and morphology, 2.7 x 1.4 x 2.5 cm.  RIGHT ovary measures 3.0 x 2.3 x 1.9 cm, probably containing a small corpus luteum.  No free pelvic fluid or adnexal masses.  IMPRESSION: Blood within the endometrial canal.  No intrauterine gestation identified.  Findings are compatible with pregnancy of unknown location.  With a quantitative beta hCG of 3720, an intrauterine gestation (if present) should be visible.  Differential diagnosis therefore is likely either  spontaneous abortion or ectopic pregnancy.  Serial quantitative beta hCG and or followup ultrasound recommended to definitively exclude ectopic pregnancy.  Electronically Signed   By: Lavonia Dana M.D.   On: 04/01/2018 14:03   --Discussed results with patient and ddx. Discussed that given provided history of heavy bleeding today, examination and ultrasound today, she likely has a complete SAB, but ectopic pregnancy still needs to be ruled out given no prior documented IUP. Advised need for repeat b-HCG in 48-72 hours. She has an appt with Dr. Lynnette Caffey Monday and would like to follow up there as she will be out of town this weekend. Discussed with Dr. Helane Rima who states okay for patient to follow up in the office Monday. Ectopic precautions were discussed at length with patient.  Assessment and Plan  Assessment: 1. Pregnancy of unknown anatomic location   2. Vaginal bleeding in pregnancy, first trimester     Plan: --Discharge home in stable condition.  --Plan to repeat HCG level in OB office on Monday --Ectopic precautions discussed at length  --Encouraged to return here or to other Urgent Care/ED if she develops worsening of symptoms, increase in pain, fever, or other concerning symptoms.   Torah Pinnock, Jenne Pane, MD 04/01/2018 2:27 PM

## 2018-04-02 LAB — HIV ANTIBODY (ROUTINE TESTING W REFLEX): HIV SCREEN 4TH GENERATION: NONREACTIVE

## 2018-04-04 DIAGNOSIS — O021 Missed abortion: Secondary | ICD-10-CM | POA: Diagnosis not present

## 2018-04-04 DIAGNOSIS — O3680X0 Pregnancy with inconclusive fetal viability, not applicable or unspecified: Secondary | ICD-10-CM | POA: Diagnosis not present

## 2018-04-04 LAB — GC/CHLAMYDIA PROBE AMP (~~LOC~~) NOT AT ARMC
Chlamydia: NEGATIVE
Neisseria Gonorrhea: NEGATIVE

## 2018-04-11 DIAGNOSIS — O039 Complete or unspecified spontaneous abortion without complication: Secondary | ICD-10-CM | POA: Diagnosis not present

## 2018-04-18 DIAGNOSIS — O039 Complete or unspecified spontaneous abortion without complication: Secondary | ICD-10-CM | POA: Diagnosis not present

## 2018-10-05 ENCOUNTER — Ambulatory Visit: Payer: 59 | Admitting: Family Medicine

## 2018-10-06 DIAGNOSIS — L03032 Cellulitis of left toe: Secondary | ICD-10-CM | POA: Diagnosis not present

## 2018-10-07 ENCOUNTER — Encounter: Payer: Self-pay | Admitting: Family Medicine

## 2018-10-07 ENCOUNTER — Ambulatory Visit: Payer: 59 | Admitting: Family Medicine

## 2018-10-07 ENCOUNTER — Other Ambulatory Visit: Payer: Self-pay

## 2018-10-07 DIAGNOSIS — M109 Gout, unspecified: Secondary | ICD-10-CM | POA: Insufficient documentation

## 2018-10-07 LAB — URIC ACID: URIC ACID, SERUM: 4.2 mg/dL (ref 2.4–7.0)

## 2018-10-07 NOTE — Progress Notes (Signed)
   Subjective:    Patient ID: Lynn Bell, female    DOB: March 15, 1986, 33 y.o.   MRN: [id number]  HPI Cellulitis- pt went to UC and was told she had cellulitis of L foot.  Was prescribed Clinda.  Monday night woke suddenly w/ 'excrutiating pain' in big toe.  Took tylenol and soaked in Epsom salts.  [redacted] weeks pregnant.  Painful to have socks on, have sheets touch it.   Review of Systems For ROS see HPI     Objective:   Physical Exam Vitals signs reviewed.  Constitutional:      Appearance: Normal appearance.  Cardiovascular:     Pulses: Normal pulses.  Musculoskeletal:        General: Swelling (mild swelling of L great toe) and tenderness (TTP over L great toe and pain w/ flexion/extension) present.  Skin:    General: Skin is warm.     Findings: Erythema (mild redness of L great toe) present.  Neurological:     Mental Status: She is alert.           Assessment & Plan:  Gout- new.  Pt's sxs and PE are not consistent w/ cellulitis.  This appears to be consistent w/ gout.  She only eats seafood b/c husband has alpha gal.  Check uric acid level as a baseline.  Since she is [redacted] weeks pregnant, will hold off on NSAIDs as sxs are improving.  Reviewed need for increased water intake, a low purine diet, soaks, and ice prn.  No need for abx.  Pt expressed understanding and is in agreement w/ plan.

## 2018-10-07 NOTE — Patient Instructions (Signed)
Follow up as needed or as scheduled We'll notify you of your lab results Drink LOTS of water Tylenol as needed for discomfort Continue to soak Ice if needed for pain relief Review the Gout handout to help w/ food choices Call with any questions or concerns Hang in there!

## 2018-10-19 DIAGNOSIS — N911 Secondary amenorrhea: Secondary | ICD-10-CM | POA: Diagnosis not present

## 2018-10-28 DIAGNOSIS — O021 Missed abortion: Secondary | ICD-10-CM | POA: Diagnosis not present

## 2018-11-04 DIAGNOSIS — O039 Complete or unspecified spontaneous abortion without complication: Secondary | ICD-10-CM | POA: Diagnosis not present

## 2018-12-30 ENCOUNTER — Ambulatory Visit (INDEPENDENT_AMBULATORY_CARE_PROVIDER_SITE_OTHER): Payer: 59 | Admitting: Family Medicine

## 2018-12-30 ENCOUNTER — Encounter: Payer: Self-pay | Admitting: Family Medicine

## 2018-12-30 ENCOUNTER — Other Ambulatory Visit: Payer: Self-pay

## 2018-12-30 VITALS — Temp 97.7°F | Ht 64.0 in | Wt 164.0 lb

## 2018-12-30 DIAGNOSIS — F419 Anxiety disorder, unspecified: Secondary | ICD-10-CM | POA: Diagnosis not present

## 2018-12-30 MED ORDER — BUSPIRONE HCL 7.5 MG PO TABS: 7.5000 mg | ORAL_TABLET | Freq: Two times a day (BID) | ORAL | 3 refills | Status: DC

## 2018-12-30 MED ORDER — ALPRAZOLAM 0.25 MG PO TABS
0.2500 mg | ORAL_TABLET | Freq: Two times a day (BID) | ORAL | 1 refills | Status: DC | PRN
Start: 2018-12-30 — End: 2019-08-22
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
## 2018-12-30 NOTE — Progress Notes (Signed)
I have discussed the procedure for the virtual visit with the patient who has given consent to proceed with assessment and treatment.   BETHANY DILLARD, CMA     

## 2019-01-23 ENCOUNTER — Ambulatory Visit (INDEPENDENT_AMBULATORY_CARE_PROVIDER_SITE_OTHER): Payer: 59 | Admitting: Family Medicine

## 2019-01-23 ENCOUNTER — Encounter: Payer: Self-pay | Admitting: Family Medicine

## 2019-01-23 ENCOUNTER — Other Ambulatory Visit: Payer: Self-pay

## 2019-01-23 VITALS — Temp 97.7°F | Ht 64.0 in | Wt 162.1 lb

## 2019-01-23 DIAGNOSIS — F419 Anxiety disorder, unspecified: Secondary | ICD-10-CM | POA: Diagnosis not present

## 2019-01-23 MED ORDER — BUSPIRONE HCL 15 MG PO TABS: 15.0000 mg | ORAL_TABLET | Freq: Two times a day (BID) | ORAL | 3 refills | Status: DC

## 2019-01-23 MED ORDER — BUPROPION HCL ER (XL) 150 MG PO TB24: 150.0000 mg | ORAL_TABLET | Freq: Every day | ORAL | 3 refills | Status: DC

## 2019-01-23 NOTE — Progress Notes (Signed)
I have discussed the procedure for the virtual visit with the patient who has given consent to proceed with assessment and treatment.   Jacen Carlini L Colbey Wirtanen, CMA     

## 2019-01-23 NOTE — Progress Notes (Signed)
   Virtual Visit via Video   I connected with patient on 01/23/19 at  9:40 AM EDT by a video enabled telemedicine application and verified that I am speaking with the correct person using two identifiers.  Location patient: Home Location provider: [facility], Office Persons participating in the virtual visit: Patient, Provider, [place] (Jess B)  I discussed the limitations of evaluation and management by telemedicine and the availability of in person appointments. The patient expressed understanding and agreed to proceed.  Subjective:   HPI:   Anxiety- at last visit we decreased pt's Wellbutrin to 150mg  daily and added Buspar 7.5mg  BID.  Pt feels that overall, anxiety is better.  Has some days that she still has 'difficulty controlling emotions'.  Feels that there is still room for improvement.  Still has tearfulness, palpitations, shortness of breath.  ROS:   See pertinent positives and negatives per HPI.  Patient Active Problem List   Diagnosis Date Noted  . Gout 10/07/2018  . Pregnant 05/30/2017  . Birth control counseling 03/30/2016  . Psoriasis 03/30/2016  . Physical exam 08/30/2015  . UTI (urinary tract infection) 05/13/2014  . Family history of early CAD 01/09/2011  . Eczema of hand 01/09/2011  . Anxiety 01/09/2011    Social History   Tobacco Use  . Smoking status: Former Smoker    Packs/day: 0.25    Types: Cigarettes  . Smokeless tobacco: Never Used  . Tobacco comment: 1 pack per week  Substance Use Topics  . Alcohol use: Yes    Comment: 1 glass of wine per night    Current Outpatient Medications:  .  ALPRAZolam (XANAX) 0.25 MG tablet, Take 1 tablet (0.25 mg total) by mouth 2 (two) times daily as needed for anxiety., Disp: 45 tablet, Rfl: 1 .  buPROPion (WELLBUTRIN XL) 150 MG 24 hr tablet, Take 150 mg by mouth 2 (two) times daily. , Disp: , Rfl:  .  busPIRone (BUSPAR) 7.5 MG tablet, Take 1 tablet (7.5 mg total) by mouth 2 (two) times daily., Disp: 60  tablet, Rfl: 3 .  clindamycin (CLEOCIN) 300 MG capsule, , Disp: , Rfl:  .  JUNEL FE 1/20 1-20 MG-MCG tablet, TK 1 T PO D, Disp: , Rfl: 6 .  NIFEdipine (PROCARDIA-XL/ADALAT-CC/NIFEDICAL-XL) 30 MG 24 hr tablet, Take 30 mg by mouth daily., Disp: , Rfl:   Allergies  Allergen Reactions  . Latex     Pt "prefers" no latex, but not an allergy Sensitive skin    Objective:   Temp 97.7 F (36.5 C) (Oral)   Ht 5\' 4"  (1.626 m)   Wt 162 lb 2 oz (73.5 kg)   LMP 01/03/2019   Breastfeeding No   BMI 27.83 kg/m   AAOx3, NAD NCAT, EOMI No obvious CN deficits Coloring WNL Pt is able to speak clearly, coherently without shortness of breath or increased work of breathing.  Thought process is linear.  Mood is appropriate.   Assessment and Plan:   Anxiety- improved but still not controlled.  Will increase Buspar to 15mg  BID and continue to monitor for improvement.  Pt expressed understanding and is in agreement w/ plan.    Annye Asa, MD 01/23/2019

## 2019-02-10 ENCOUNTER — Encounter

## 2019-02-10 ENCOUNTER — Encounter: Payer: 59 | Admitting: Family Medicine

## 2019-02-13 ENCOUNTER — Encounter: Payer: Self-pay | Admitting: Family Medicine

## 2019-02-13 ENCOUNTER — Ambulatory Visit (INDEPENDENT_AMBULATORY_CARE_PROVIDER_SITE_OTHER): Payer: 59 | Admitting: Family Medicine

## 2019-02-13 ENCOUNTER — Other Ambulatory Visit: Payer: Self-pay

## 2019-02-13 VITALS — Temp 97.0°F | Ht 64.0 in | Wt 163.2 lb

## 2019-02-13 DIAGNOSIS — F419 Anxiety disorder, unspecified: Secondary | ICD-10-CM

## 2019-02-13 NOTE — Progress Notes (Signed)
I have discussed the procedure for the virtual visit with the patient who has given consent to proceed with assessment and treatment.   Jessica L Brodmerkel, CMA     

## 2019-02-13 NOTE — Progress Notes (Signed)
   Virtual Visit via Video   I connected with patient on 02/13/19 at  9:20 AM EDT by a video enabled telemedicine application and verified that I am speaking with the correct person using two identifiers.  Location patient: Home Location provider: [facility], Office Persons participating in the virtual visit: Patient, Provider, [place] (Jess B)  I discussed the limitations of evaluation and management by telemedicine and the availability of in person appointments. The patient expressed understanding and agreed to proceed.  Subjective:   HPI:   Anxiety- ongoing issue.  At last visit, pt's Buspar was increased to 15mg  BID.  Also on Wellbutrin 150mg  daily.  Pt reports feeling 'good' and that increase in medication 'definitely helped'.  Moved in w/ mom and dad ~1 week ago while house hunting.  Trying to adjust to new normal.    ROS:   See pertinent positives and negatives per HPI.  Patient Active Problem List   Diagnosis Date Noted  . Gout 10/07/2018  . Pregnant 05/30/2017  . Birth control counseling 03/30/2016  . Psoriasis 03/30/2016  . Physical exam 08/30/2015  . UTI (urinary tract infection) 05/13/2014  . Family history of early CAD 01/09/2011  . Eczema of hand 01/09/2011  . Anxiety 01/09/2011    Social History   Tobacco Use  . Smoking status: Former Smoker    Packs/day: 0.25    Types: Cigarettes  . Smokeless tobacco: Never Used  . Tobacco comment: 1 pack per week  Substance Use Topics  . Alcohol use: Yes    Comment: 1 glass of wine per night    Current Outpatient Medications:  .  ALPRAZolam (XANAX) 0.25 MG tablet, Take 1 tablet (0.25 mg total) by mouth 2 (two) times daily as needed for anxiety., Disp: 45 tablet, Rfl: 1 .  buPROPion (WELLBUTRIN XL) 150 MG 24 hr tablet, Take 1 tablet (150 mg total) by mouth daily., Disp: 30 tablet, Rfl: 3 .  busPIRone (BUSPAR) 15 MG tablet, Take 1 tablet (15 mg total) by mouth 2 (two) times daily., Disp: 60 tablet, Rfl: 3 .   JUNEL FE 1/20 1-20 MG-MCG tablet, TK 1 T PO D, Disp: , Rfl: 6 .  NIFEdipine (PROCARDIA-XL/ADALAT-CC/NIFEDICAL-XL) 30 MG 24 hr tablet, Take 30 mg by mouth daily., Disp: , Rfl:   Allergies  Allergen Reactions  . Latex     Pt "prefers" no latex, but not an allergy Sensitive skin    Objective:   Temp (!) 97 F (36.1 C) (Temporal)   Ht 5\' 4"  (1.626 m)   Wt 163 lb 4 oz (74 kg)   Breastfeeding No   BMI 28.02 kg/m   AAOx3, NAD NCAT, EOMI No obvious CN deficits Coloring WNL Pt is able to speak clearly, coherently without shortness of breath or increased work of breathing.  Thought process is linear.  Mood is appropriate.   Assessment and Plan:   Depression/anxiety- much improved.  Pt feels that medications are at the right place and she is feeling much more balanced.  No changes at this time.  Will follow.   Annye Asa, MD 02/13/2019

## 2019-06-22 LAB — OB RESULTS CONSOLE HEPATITIS B SURFACE ANTIGEN: Hepatitis B Surface Ag: NEGATIVE

## 2019-06-22 LAB — OB RESULTS CONSOLE GC/CHLAMYDIA
Chlamydia: NEGATIVE
Gonorrhea: NEGATIVE

## 2019-06-22 LAB — OB RESULTS CONSOLE RUBELLA ANTIBODY, IGM: Rubella: IMMUNE

## 2019-06-22 LAB — OB RESULTS CONSOLE ANTIBODY SCREEN: Antibody Screen: NEGATIVE

## 2019-06-22 LAB — OB RESULTS CONSOLE ABO/RH: RH Type: POSITIVE

## 2019-06-22 LAB — OB RESULTS CONSOLE RPR: RPR: NONREACTIVE

## 2019-06-22 LAB — OB RESULTS CONSOLE HIV ANTIBODY (ROUTINE TESTING): HIV: NONREACTIVE

## 2019-08-22 ENCOUNTER — Other Ambulatory Visit: Payer: Self-pay

## 2019-08-22 ENCOUNTER — Encounter: Payer: Self-pay | Admitting: Physician Assistant

## 2019-08-22 ENCOUNTER — Ambulatory Visit (INDEPENDENT_AMBULATORY_CARE_PROVIDER_SITE_OTHER): Payer: 59 | Admitting: Physician Assistant

## 2019-08-22 VITALS — Temp 97.2°F

## 2019-08-22 DIAGNOSIS — Z20822 Contact with and (suspected) exposure to covid-19: Secondary | ICD-10-CM

## 2019-08-22 DIAGNOSIS — Z20828 Contact with and (suspected) exposure to other viral communicable diseases: Secondary | ICD-10-CM

## 2019-08-22 NOTE — Patient Instructions (Signed)
Instructions sent to my chart  -Due to your current symptoms and known exposure to Covid via your husband, no indication to send you for testing at present. -This is Covid and we will treat as such.  -Please keep well-hydrated and get plenty of rest. -Continue Tylenol for aches and pains.  Since you are pregnant, this does limit medications.  You can also utilize warm baths, heating pad and over-the-counter topical creams to help with aches. -Continue your prenatal vitamins as directed by your GYN. -Consider putting a humidifier in the bedroom and running at night. -Contact your OB to make them aware you are being treated for Covid.  They can guide you further on precautions to take for your baby. -I have enrolled you in a symptom monitoring program through my chart.  Please answer questionnaires daily so that we can keep an eye on you.  -You need to remain quarantined for at least 10 days from symptom onset, no fever for 72 hours, and symptoms resolving.  -Have a very low threshold for you being seen in person at urgent care or ER.  If you notice any significant shortness of breath, please be seen immediately.

## 2019-08-22 NOTE — Progress Notes (Signed)
   Virtual Visit via Video   I connected with patient on 08/22/19 at  9:30 AM EST by a video enabled telemedicine application and verified that I am speaking with the correct person using two identifiers.  Location patient: Home Location provider: [facility], Office Persons participating in the virtual visit: Patient, Provider, [place] (Patina Moore)  I discussed the limitations of evaluation and management by telemedicine and the availability of in person appointments. The patient expressed understanding and agreed to proceed.  Subjective:   HPI:   Patient presents via doxy doxy.me today to discuss sudden onset of URI symptoms.  Notes her husband tested positive for Covid last Sunday.  He has been quarantining upstairs in their home since that time.  Patient notes abrupt onset of dry cough, body aches, scratchy throat, chills starting yesterday.  Aches have worsened since that time.  Denies fever, chest pain, shortness of breath, loss of taste or smell.  Denies diarrhea, nausea or vomiting.  Is keeping well hydrated.  Patient notes that she is [redacted] weeks pregnant.  Is taking Tylenol for aches.  Has not contacted her OB/GYN.  ROS:   See pertinent positives and negatives per HPI.  Patient Active Problem List   Diagnosis Date Noted  . Gout 10/07/2018  . Pregnant 05/30/2017  . Birth control counseling 03/30/2016  . Psoriasis 03/30/2016  . Physical exam 08/30/2015  . UTI (urinary tract infection) 05/13/2014  . Family history of early CAD 01/09/2011  . Eczema of hand 01/09/2011  . Anxiety 01/09/2011    Social History   Tobacco Use  . Smoking status: Former Smoker    Packs/day: 0.25    Types: Cigarettes  . Smokeless tobacco: Never Used  . Tobacco comment: 1 pack per week  Substance Use Topics  . Alcohol use: Yes    Comment: 1 glass of wine per night   No current outpatient medications on file.  Allergies  Allergen Reactions  . Latex     Pt "prefers" no latex, but not  an allergy Sensitive skin    Objective:   Temp (!) 97.2 F (36.2 C) (Temporal)   LMP 01/03/2019   Patient is well-developed, well-nourished in no acute distress.  Resting comfortably at home.  Head is normocephalic, atraumatic.  No labored breathing.  Speech is clear and coherent with logical content.  Patient is alert and oriented at baseline.   Assessment and Plan:   1. Suspected COVID-19 virus infection Husband with positive test.  Patient with classic symptoms, thankfully mild.  No high fever or shortness of breath.  Supportive measures and OTC medications reviewed with patient.  Patient enrolled in MyChart symptom monitoring.  Recommended she contact her OB to make them aware and for further instructions regarding her unborn child.  Strict ER precautions reviewed with patient.  Instructions sent to my chart.    Leeanne Rio, PA-C 08/22/2019

## 2019-08-22 NOTE — Progress Notes (Signed)
I have discussed the procedure for the virtual visit with the patient who has given consent to proceed with assessment and treatment.   Vicci Reder S [place] Zou, CMA     

## 2019-12-12 ENCOUNTER — Other Ambulatory Visit: Payer: Self-pay

## 2019-12-12 ENCOUNTER — Encounter: Payer: Self-pay | Admitting: Physician Assistant

## 2019-12-12 ENCOUNTER — Telehealth (INDEPENDENT_AMBULATORY_CARE_PROVIDER_SITE_OTHER): Payer: 59 | Admitting: Physician Assistant

## 2019-12-12 VITALS — BP 145/91 | HR 108 | Temp 99.5°F

## 2019-12-12 DIAGNOSIS — R0981 Nasal congestion: Secondary | ICD-10-CM

## 2019-12-12 MED ORDER — AMOXICILLIN 875 MG PO TABS: 875.0000 mg | ORAL_TABLET | Freq: Two times a day (BID) | ORAL | 0 refills | Status: DC

## 2019-12-12 NOTE — Progress Notes (Signed)
Virtual Visit via Video   I connected with Lynn Bell on 12/12/19 at 11:30 AM EDT by a video enabled telemedicine application and verified that I am speaking with the correct person using two identifiers. Location patient: Home Location provider: [facility], Office Persons participating in the virtual visit: Elanore Rehl, Inda Coke PA-C, Anselmo Pickler, LPN   I discussed the limitations of evaluation and management by telemedicine and the availability of in person appointments. The patient expressed understanding and agreed to proceed.  I acted as a Education administrator for [employer], PA-C [employer], LPN  Subjective:   HPI:   Patient is requesting evaluation for possible COVID-19.  Symptom onset: Started yesterday, pt is [redacted] weeks pregnant and OB is aware blood pressure is elevated.  Travel/contacts: No travel or exposure, pt and husband both had COVID back in November 2020 (she was never formally tested)  Patient endorses the following symptoms: subjective fever, sinus headache, sinus congestion, sinus pain, ear fullness, ear pain, sore throat, shortness of breath and myalgia, body aches.   Patient denies the following symptoms: rhinorrhea, difficulty swallowing and chest pain  Treatments tried: Tylenol   She is eating and drinking without difficulty. She has no difficulty breathing, dizziness, lightheadedness. Denies significant increase in LE swelling. Denies dysuria -- urine was actually checked at the ob-gyn yesterday and patient reports that it was negative for infection.  Patient risk factors: Current [id number] risk of complications score: 1 Smoking status: Lynn Bell  reports that she has quit smoking. Her smoking use included cigarettes. She smoked 0.25 packs per day. She has never used smokeless tobacco. If female, currently pregnant? [x]   Yes []   No  ROS: See pertinent positives and negatives per HPI.  Patient Active Problem List   Diagnosis Date  Noted  . Gout 10/07/2018  . Pregnant 05/30/2017  . Birth control counseling 03/30/2016  . Psoriasis 03/30/2016  . Physical exam 08/30/2015  . UTI (urinary tract infection) 05/13/2014  . Family history of early CAD 01/09/2011  . Eczema of hand 01/09/2011  . Anxiety 01/09/2011    Social History   Tobacco Use  . Smoking status: Former Smoker    Packs/day: 0.25    Types: Cigarettes  . Smokeless tobacco: Never Used  . Tobacco comment: 1 pack per week  Substance Use Topics  . Alcohol use: Yes    Comment: 1 glass of wine per night    Current Outpatient Medications:  .  Prenatal Vit-Fe Fumarate-FA (PRENATAL VITAMIN PO), Take 1 tablet by mouth daily., Disp: , Rfl:  .  amoxicillin (AMOXIL) 875 MG tablet, Take 1 tablet (875 mg total) by mouth 2 (two) times daily., Disp: 20 tablet, Rfl: 0  Allergies  Allergen Reactions  . Latex     Pt "prefers" no latex, but not an allergy Sensitive skin    Objective:   VITALS: Per patient if applicable, see vitals. GENERAL: Alert, appears well and in no acute distress. HEENT: Atraumatic, conjunctiva clear, no obvious abnormalities on inspection of external nose and ears. NECK: Normal movements of the head and neck. CARDIOPULMONARY: No increased WOB. Speaking in clear sentences. I:E ratio WNL.  MS: Moves all visible extremities without noticeable abnormality. PSYCH: Pleasant and cooperative, well-groomed. Speech normal rate and rhythm. Affect is appropriate. Insight and judgement are appropriate. Attention is focused, linear, and appropriate.  NEURO: CN grossly intact. Oriented as arrived to appointment on time with no prompting. Moves both UE equally.  SKIN: No obvious lesions, wounds, erythema, or cyanosis  noted on face or hands.  Assessment and Plan:   Grettel was seen today for covid symptoms.  Diagnoses and all orders for this visit:  Sinus congestion  Other orders -     amoxicillin (AMOXIL) 875 MG tablet; Take 1 tablet (875 mg total)  by mouth 2 (two) times daily.   Patient has a respiratory illness without signs of acute distress or respiratory compromise at this time. This is likely a viral infection, which can come from a number of respiratory viruses.   We are going to send patient for COVID-19 testing. As a precaution, they have been advised to remain home until COVID-19 results and then possible further quarantine after that based on results and symptoms.   Very low threshold to go to the ER if any worsening symptoms.  I did give her a pocket rx of amoxicillin should her symptoms persist beyond 7 days or if they worsen in the interim. I recommended very close follow-up with ob-gyn regarding her blood pressure.  . Reviewed expectations Bell: course of current medical issues. . Discussed self-management of symptoms. . Outlined signs and symptoms indicating need for more acute intervention. . Patient verbalized understanding and all questions were answered. Marland Kitchen Health Maintenance issues including appropriate healthy diet, exercise, and smoking avoidance were discussed with patient. . See orders for this visit as documented in the electronic medical record.  I discussed the assessment and treatment plan with the patient. The patient was provided an opportunity to ask questions and all were answered. The patient agreed with the plan and demonstrated an understanding of the instructions.   The patient was advised to call back or seek an in-person evaluation if the symptoms worsen or if the condition fails to improve as anticipated.   CMA or LPN served as scribe during this visit. History, Physical, and Plan performed by medical provider. The above documentation has been reviewed and is accurate and complete.  [place], [place] 12/12/2019

## 2019-12-13 ENCOUNTER — Telehealth: Payer: Self-pay

## 2019-12-13 NOTE — Telephone Encounter (Signed)
LMOVM per Aldona Bar to see how patient is feeling today.

## 2019-12-26 ENCOUNTER — Encounter (HOSPITAL_COMMUNITY): Payer: Self-pay | Admitting: *Deleted

## 2019-12-26 ENCOUNTER — Telehealth (HOSPITAL_COMMUNITY): Payer: Self-pay | Admitting: *Deleted

## 2019-12-26 NOTE — Telephone Encounter (Signed)
Preadmission screen  

## 2019-12-26 NOTE — Patient Instructions (Signed)
Lynn Bell  12/26/2019   Your procedure is scheduled on:  01/07/2020  Arrive at 61 at Entrance C on [address] at [facility]  and [address]. You are invited to use the FREE valet parking or use the Visitor's parking deck.  Pick up the phone at the desk and dial [phone].  Call this number if you have problems the morning of surgery: [phone]  Remember:   Do not eat food:(After Midnight) Desps de medianoche.  Do not drink clear liquids: (After Midnight) Desps de medianoche.  Take these medicines the morning of surgery with A SIP OF WATER:  none   Do not wear jewelry, make-up or nail polish.  Do not wear lotions, powders, or perfumes. Do not wear deodorant.  Do not shave 48 hours prior to surgery.  Do not bring valuables to the hospital.  [facility] is not   responsible for any belongings or valuables brought to the hospital.  Contacts, dentures or bridgework may not be worn into surgery.  Leave suitcase in the car. After surgery it may be brought to your room.  For patients admitted to the hospital, checkout time is 11:00 AM the day of              discharge.      Please read over the following fact sheets that you were given:     Preparing for Surgery

## 2019-12-27 ENCOUNTER — Telehealth (HOSPITAL_COMMUNITY): Payer: Self-pay | Admitting: *Deleted

## 2019-12-27 NOTE — Telephone Encounter (Signed)
Preadmission screen  

## 2019-12-29 ENCOUNTER — Telehealth (HOSPITAL_COMMUNITY): Payer: Self-pay | Admitting: *Deleted

## 2019-12-29 NOTE — Telephone Encounter (Signed)
Preadmission screen  

## 2020-01-01 ENCOUNTER — Telehealth (HOSPITAL_COMMUNITY): Payer: Self-pay | Admitting: *Deleted

## 2020-01-01 ENCOUNTER — Encounter (HOSPITAL_COMMUNITY): Payer: Self-pay

## 2020-01-01 NOTE — Telephone Encounter (Signed)
Preadmission screen  

## 2020-01-04 NOTE — H&P (Signed)
Lynn Bell is a 34 y.o. female G4P1021 at 83 weeks presenting for repeat C/S for gestational hypertension.  Patient has been asymptomatic; no HA, CP/SOB, RUQ pain, or visual disturbance.  Has taken low dose asa this pregnancy for h/o gestational hypertension.  Patient has previous C/S x 1 and desires repeat.  GBS positive.   OB History    Gravida  4   Para  1   Term  1   Preterm      AB  2   Living  1     SAB  2   TAB      Ectopic      Multiple  0   Live Births  1          Past Medical History:  Diagnosis Date  . Anxiety   . Cardiac arrhythmia   . Depression    PPD  . History of chicken pox   . Pregnancy induced hypertension   . UTI (lower urinary tract infection)    Past Surgical History:  Procedure Laterality Date  . CESAREAN SECTION N/A 06/01/2017   Procedure: CESAREAN SECTION;  Surgeon: Everlene Farrier, MD;  Location: [place];  Service: Obstetrics;  Laterality: N/A;  . TONSILLECTOMY     Family History: family history includes Breast cancer in her maternal grandmother; Cancer in her maternal grandfather; Heart disease in her father and mother; Hyperlipidemia in her father and mother; Hypertension in her father and mother; Kidney disease in her paternal grandfather; Pancreatic cancer in her mother. Social History:  reports that she has quit smoking. Her smoking use included cigarettes. She smoked 0.25 packs per day. She has never used smokeless tobacco. She reports current alcohol use. She reports that she does not use drugs.     Maternal Diabetes: No Genetic Screening: Normal Maternal Ultrasounds/Referrals: Normal Fetal Ultrasounds or other Referrals:  None Maternal Substance Abuse:  No Significant Maternal Medications:  None Significant Maternal Lab Results:  Group B Strep positive Other Comments:  None  Review of Systems Maternal Medical History:  Prenatal complications: PIH.   Prenatal Complications - Diabetes: none.      Last  menstrual period 04/23/2019. Maternal Exam:  Abdomen: Patient reports no abdominal tenderness. Surgical scars: low transverse.   Fundal height is c/w dates.   Estimated fetal weight is 8#.       Physical Exam  Constitutional: She is oriented to person, place, and time. She appears well-developed and well-nourished.  Respiratory: Effort normal.  GI: Soft. There is no rebound and no guarding.  Neurological: She is alert and oriented to person, place, and time.  Skin: Skin is warm and dry.  Psychiatric: She has a normal mood and affect. Her behavior is normal.    Prenatal labs: ABO, Rh: A/Positive/-- (10/08 0000) Antibody: Negative (10/08 0000) Rubella: Immune (10/08 0000) RPR: Nonreactive (10/08 0000)  HBsAg: Negative (10/08 0000)  HIV: Non-reactive (10/08 0000)  GBS:   Positive  Assessment/Plan: 34yo [id number] at 66 weeks with gestational hypertension, previous C/S x 1 -Repeat C/S-patient is counseled re: risk of bleeding, infection, scarring, and damage to surrounding structures.  She understands implications in future pregnancies including abnormal placentation and uterine rupture.  All questions were answered and patient wishes to proceed.   Linda Hedges 01/04/2020, 11:41 AM

## 2020-01-05 ENCOUNTER — Other Ambulatory Visit: Payer: Self-pay

## 2020-01-05 ENCOUNTER — Other Ambulatory Visit (HOSPITAL_COMMUNITY)
Admission: RE | Admit: 2020-01-05 | Discharge: 2020-01-05 | Disposition: A | Discharge status: Home / Self Care | Payer: 59 | Source: Ambulatory Visit | Attending: Obstetrics & Gynecology | Admitting: Obstetrics & Gynecology

## 2020-01-05 DIAGNOSIS — Z20822 Contact with and (suspected) exposure to covid-19: Secondary | ICD-10-CM | POA: Insufficient documentation

## 2020-01-05 DIAGNOSIS — Z01812 Encounter for preprocedural laboratory examination: Secondary | ICD-10-CM | POA: Insufficient documentation

## 2020-01-05 HISTORY — DX: Depression, unspecified: F32.A

## 2020-01-05 HISTORY — DX: Gestational (pregnancy-induced) hypertension without significant proteinuria, unspecified trimester: O13.9

## 2020-01-05 LAB — COMPREHENSIVE METABOLIC PANEL
ALT: 16 U/L (ref 0–44)
AST: 22 U/L (ref 15–41)
Albumin: 2.7 g/dL — ABNORMAL LOW (ref 3.5–5.0)
Alkaline Phosphatase: 93 U/L (ref 38–126)
Anion gap: 7 (ref 5–15)
BUN: 10 mg/dL (ref 6–20)
CO2: 22 mmol/L (ref 22–32)
Calcium: 8.7 mg/dL — ABNORMAL LOW (ref 8.9–10.3)
Chloride: 106 mmol/L (ref 98–111)
Creatinine, Ser: 0.65 mg/dL (ref 0.44–1.00)
GFR calc Af Amer: >60 mL/min (ref >60)
GFR calc non Af Amer: >60 mL/min (ref >60)
Glucose, Bld: 88 mg/dL (ref 70–99)
Potassium: 3.9 mmol/L (ref 3.5–5.1)
Sodium: 135 mmol/L (ref 135–145)
Total Bilirubin: 0.6 mg/dL (ref 0.3–1.2)
Total Protein: 6 g/dL — ABNORMAL LOW (ref 6.5–8.1)

## 2020-01-05 LAB — SARS CORONAVIRUS 2 (TAT 6-24 HRS): SARS Coronavirus 2: NEGATIVE

## 2020-01-05 LAB — TYPE AND SCREEN
ABO/RH(D): A POS
Antibody Screen: NEGATIVE

## 2020-01-05 LAB — CBC
HCT: 34.4 % — ABNORMAL LOW (ref 36.0–46.0)
Hemoglobin: 11.4 g/dL — ABNORMAL LOW (ref 12.0–15.0)
MCH: 29.8 pg (ref 26.0–34.0)
MCHC: 33.1 g/dL (ref 30.0–36.0)
MCV: 89.8 fL (ref 80.0–100.0)
Platelets: 198 10*3/uL (ref 150–400)
RBC: 3.83 MIL/uL — ABNORMAL LOW (ref 3.87–5.11)
RDW: 13.3 % (ref 11.5–15.5)
WBC: 10.7 10*3/uL — ABNORMAL HIGH (ref 4.0–10.5)
nRBC: 0 % (ref 0.0–0.2)

## 2020-01-05 LAB — RPR: RPR Ser Ql: NONREACTIVE

## 2020-01-05 LAB — ABO/RH: ABO/RH(D): A POS

## 2020-01-05 NOTE — MAU Note (Signed)
Asymptomatic, swab collected. Waiting on lab 

## 2020-01-06 NOTE — Anesthesia Preprocedure Evaluation (Signed)
Anesthesia Evaluation  Patient identified by MRN, date of birth, ID band Patient awake    Reviewed: Allergy & Precautions, H&P , NPO status , Patient's Chart, lab work & pertinent test results  History of Anesthesia Complications Negative for: history of anesthetic complications  Airway Mallampati: II  TM Distance: >3 FB Neck ROM: full    Dental no notable dental hx. (+) Teeth Intact   Pulmonary former smoker,    Pulmonary exam normal breath sounds clear to auscultation       Cardiovascular hypertension, Normal cardiovascular exam Rhythm:regular Rate:Normal  PIH   Neuro/Psych negative neurological ROS  negative psych ROS   GI/Hepatic negative GI ROS, Neg liver ROS,   Endo/Other  Obesity BMI 34  Renal/GU negative Renal ROS  negative genitourinary   Musculoskeletal   Abdominal (+) + obese,   Peds  Hematology  (+) Blood dyscrasia, anemia , hct 34.4, plt 198   Anesthesia Other Findings   Reproductive/Obstetrics (+) Pregnancy 1 prior c section                             Anesthesia Physical Anesthesia Plan  ASA: III  Anesthesia Plan: Spinal   Post-op Pain Management:    Induction:   PONV Risk Score and Plan: 2 and Ondansetron, Dexamethasone and Treatment may vary due to age or medical condition  Airway Management Planned: Natural Airway  Additional Equipment: None  Intra-op Plan:   Post-operative Plan:   Informed Consent: I have reviewed the patients History and Physical, chart, labs and discussed the procedure including the risks, benefits and alternatives for the proposed anesthesia with the patient or authorized representative who has indicated his/her understanding and acceptance.       Plan Discussed with: CRNA  Anesthesia Plan Comments:         Anesthesia Quick Evaluation

## 2020-01-07 ENCOUNTER — Inpatient Hospital Stay (HOSPITAL_COMMUNITY)
Admission: RE | Admit: 2020-01-07 | Discharge: 2020-01-09 | DRG: 788 | Disposition: A | Discharge status: Home / Self Care | Payer: 59 | Attending: Obstetrics & Gynecology | Admitting: Obstetrics & Gynecology

## 2020-01-07 ENCOUNTER — Inpatient Hospital Stay (HOSPITAL_COMMUNITY): Payer: 59 | Admitting: Anesthesiology

## 2020-01-07 ENCOUNTER — Encounter (HOSPITAL_COMMUNITY): Payer: Self-pay | Admitting: Obstetrics & Gynecology

## 2020-01-07 ENCOUNTER — Encounter (HOSPITAL_COMMUNITY)
Admission: RE | Disposition: A | Discharge status: Home / Self Care | Payer: Self-pay | Source: Home / Self Care | Attending: Obstetrics & Gynecology

## 2020-01-07 DIAGNOSIS — Z20822 Contact with and (suspected) exposure to covid-19: Secondary | ICD-10-CM | POA: Diagnosis present

## 2020-01-07 DIAGNOSIS — O99824 Streptococcus B carrier state complicating childbirth: Secondary | ICD-10-CM | POA: Diagnosis present

## 2020-01-07 DIAGNOSIS — O139 Gestational [pregnancy-induced] hypertension without significant proteinuria, unspecified trimester: Secondary | ICD-10-CM | POA: Diagnosis present

## 2020-01-07 DIAGNOSIS — O134 Gestational [pregnancy-induced] hypertension without significant proteinuria, complicating childbirth: Secondary | ICD-10-CM | POA: Diagnosis present

## 2020-01-07 DIAGNOSIS — Z87891 Personal history of nicotine dependence: Secondary | ICD-10-CM

## 2020-01-07 DIAGNOSIS — Z98891 History of uterine scar from previous surgery: Secondary | ICD-10-CM

## 2020-01-07 DIAGNOSIS — O9902 Anemia complicating childbirth: Secondary | ICD-10-CM | POA: Diagnosis present

## 2020-01-07 DIAGNOSIS — E669 Obesity, unspecified: Secondary | ICD-10-CM | POA: Diagnosis present

## 2020-01-07 DIAGNOSIS — Z3A37 37 weeks gestation of pregnancy: Secondary | ICD-10-CM | POA: Diagnosis not present

## 2020-01-07 DIAGNOSIS — Z6834 Body mass index (BMI) 34.0-34.9, adult: Secondary | ICD-10-CM

## 2020-01-07 DIAGNOSIS — D649 Anemia, unspecified: Secondary | ICD-10-CM | POA: Diagnosis present

## 2020-01-07 DIAGNOSIS — O34211 Maternal care for low transverse scar from previous cesarean delivery: Principal | ICD-10-CM | POA: Diagnosis present

## 2020-01-07 DIAGNOSIS — O99214 Obesity complicating childbirth: Secondary | ICD-10-CM | POA: Diagnosis present

## 2020-01-07 SURGERY — Surgical Case
Anesthesia: Spinal

## 2020-01-07 MED ORDER — SIMETHICONE 80 MG PO CHEW
80.0000 mg | CHEWABLE_TABLET | ORAL | Status: DC | PRN
  Administered 2020-01-07: 80 mg via ORAL
  Filled 2020-01-07: qty 1

## 2020-01-07 MED ORDER — PROMETHAZINE HCL 25 MG/ML IJ SOLN: 6.2500 mg | INTRAMUSCULAR | Status: DC | PRN

## 2020-01-07 MED ORDER — ZOLPIDEM TARTRATE 5 MG PO TABS: 5.0000 mg | ORAL_TABLET | Freq: Every evening | ORAL | Status: DC | PRN

## 2020-01-07 MED ORDER — ACETAMINOPHEN 325 MG PO TABS: 650.0000 mg | ORAL_TABLET | ORAL | Status: DC | PRN

## 2020-01-07 MED ORDER — PRENATAL MULTIVITAMIN CH
1.0000 | ORAL_TABLET | Freq: Every day | ORAL | Status: DC
  Administered 2020-01-08 – 2020-01-09 (×2): 1 via ORAL
  Filled 2020-01-07 (×2): qty 1

## 2020-01-07 MED ORDER — MORPHINE SULFATE (PF) 0.5 MG/ML IJ SOLN
INTRAMUSCULAR | Status: DC | PRN
  Administered 2020-01-07: 150 ug via EPIDURAL

## 2020-01-07 MED ORDER — NALBUPHINE HCL 10 MG/ML IJ SOLN: 5.0000 mg | INTRAMUSCULAR | Status: DC | PRN

## 2020-01-07 MED ORDER — TETANUS-DIPHTH-ACELL PERTUSSIS 5-2.5-18.5 LF-MCG/0.5 IM SUSP: 0.5000 mL | Freq: Once | INTRAMUSCULAR | Status: DC

## 2020-01-07 MED ORDER — DIPHENHYDRAMINE HCL 25 MG PO CAPS
25.0000 mg | ORAL_CAPSULE | ORAL | Status: DC | PRN
  Administered 2020-01-07: 25 mg via ORAL
  Filled 2020-01-07: qty 1

## 2020-01-07 MED ORDER — OXYCODONE HCL 5 MG PO TABS
5.0000 mg | ORAL_TABLET | ORAL | Status: DC | PRN
  Administered 2020-01-07 – 2020-01-09 (×2): 5 mg via ORAL
  Filled 2020-01-07 (×2): qty 1

## 2020-01-07 MED ORDER — FENTANYL CITRATE (PF) 100 MCG/2ML IJ SOLN
INTRAMUSCULAR | Status: AC
  Filled 2020-01-07: qty 2

## 2020-01-07 MED ORDER — OXYTOCIN 40 UNITS IN NORMAL SALINE INFUSION - SIMPLE MED
INTRAVENOUS | Status: AC
  Filled 2020-01-07: qty 1000

## 2020-01-07 MED ORDER — NALOXONE HCL 4 MG/10ML IJ SOLN
1.0000 ug/kg/h | INTRAVENOUS | Status: DC | PRN
  Filled 2020-01-07: qty 5

## 2020-01-07 MED ORDER — NALBUPHINE HCL 10 MG/ML IJ SOLN: 5.0000 mg | Freq: Once | INTRAMUSCULAR | Status: DC | PRN

## 2020-01-07 MED ORDER — HYDROMORPHONE HCL 1 MG/ML IJ SOLN: 0.2500 mg | INTRAMUSCULAR | Status: DC | PRN

## 2020-01-07 MED ORDER — ACETAMINOPHEN 500 MG PO TABS
1000.0000 mg | ORAL_TABLET | Freq: Four times a day (QID) | ORAL | Status: AC
  Administered 2020-01-07 – 2020-01-08 (×4): 1000 mg via ORAL
  Filled 2020-01-07 (×5): qty 2

## 2020-01-07 MED ORDER — COCONUT OIL OIL
1.0000 "application " | TOPICAL_OIL | Status: DC | PRN
  Administered 2020-01-08: 1 via TOPICAL

## 2020-01-07 MED ORDER — KETOROLAC TROMETHAMINE 30 MG/ML IJ SOLN
30.0000 mg | Freq: Four times a day (QID) | INTRAMUSCULAR | Status: AC | PRN
  Administered 2020-01-07 (×2): 30 mg via INTRAVENOUS
  Filled 2020-01-07 (×2): qty 1

## 2020-01-07 MED ORDER — PHENYLEPHRINE HCL-NACL 10-0.9 MG/250ML-% IV SOLN
INTRAVENOUS | Status: DC | PRN
  Administered 2020-01-07: 50 ug/min via INTRAVENOUS

## 2020-01-07 MED ORDER — PHENYLEPHRINE 40 MCG/ML (10ML) SYRINGE FOR IV PUSH (FOR BLOOD PRESSURE SUPPORT)
PREFILLED_SYRINGE | INTRAVENOUS | Status: AC
  Filled 2020-01-07: qty 10

## 2020-01-07 MED ORDER — DIBUCAINE (PERIANAL) 1 % EX OINT: 1.0000 "application " | TOPICAL_OINTMENT | CUTANEOUS | Status: DC | PRN

## 2020-01-07 MED ORDER — MEPERIDINE HCL 25 MG/ML IJ SOLN: 6.2500 mg | INTRAMUSCULAR | Status: DC | PRN

## 2020-01-07 MED ORDER — KETOROLAC TROMETHAMINE 30 MG/ML IJ SOLN: 30.0000 mg | Freq: Four times a day (QID) | INTRAMUSCULAR | Status: AC | PRN

## 2020-01-07 MED ORDER — ONDANSETRON HCL 4 MG/2ML IJ SOLN
INTRAMUSCULAR | Status: AC
  Filled 2020-01-07: qty 2

## 2020-01-07 MED ORDER — PHENYLEPHRINE HCL (PRESSORS) 10 MG/ML IV SOLN
INTRAVENOUS | Status: DC | PRN
Start: 2020-01-07 — End: 2020-01-07
  Administered 2020-01-07: 80 ug via INTRAVENOUS

## 2020-01-07 MED ORDER — FENTANYL CITRATE (PF) 100 MCG/2ML IJ SOLN
INTRAMUSCULAR | Status: DC | PRN
  Administered 2020-01-07: 15 ug via INTRATHECAL

## 2020-01-07 MED ORDER — MENTHOL 3 MG MT LOZG: 1.0000 | LOZENGE | OROMUCOSAL | Status: DC | PRN

## 2020-01-07 MED ORDER — OXYTOCIN 40 UNITS IN NORMAL SALINE INFUSION - SIMPLE MED
INTRAVENOUS | Status: DC | PRN
  Administered 2020-01-07: 500 mL via INTRAVENOUS

## 2020-01-07 MED ORDER — PHENYLEPHRINE HCL-NACL 20-0.9 MG/250ML-% IV SOLN
INTRAVENOUS | Status: AC
  Filled 2020-01-07: qty 250

## 2020-01-07 MED ORDER — DIPHENHYDRAMINE HCL 25 MG PO CAPS: 25.0000 mg | ORAL_CAPSULE | Freq: Four times a day (QID) | ORAL | Status: DC | PRN

## 2020-01-07 MED ORDER — LACTATED RINGERS IV SOLN: INTRAVENOUS | Status: DC

## 2020-01-07 MED ORDER — OXYCODONE HCL 5 MG/5ML PO SOLN: 5.0000 mg | Freq: Once | ORAL | Status: DC | PRN

## 2020-01-07 MED ORDER — KETOROLAC TROMETHAMINE 30 MG/ML IJ SOLN: 30.0000 mg | Freq: Once | INTRAMUSCULAR | Status: DC | PRN

## 2020-01-07 MED ORDER — MORPHINE SULFATE (PF) 0.5 MG/ML IJ SOLN
INTRAMUSCULAR | Status: AC
  Filled 2020-01-07: qty 10

## 2020-01-07 MED ORDER — OXYCODONE HCL 5 MG PO TABS: 5.0000 mg | ORAL_TABLET | Freq: Once | ORAL | Status: DC | PRN

## 2020-01-07 MED ORDER — SODIUM CHLORIDE 0.9% FLUSH: 3.0000 mL | INTRAVENOUS | Status: DC | PRN

## 2020-01-07 MED ORDER — OXYCODONE HCL 5 MG PO TABS
10.0000 mg | ORAL_TABLET | ORAL | Status: DC | PRN
  Administered 2020-01-08: 10 mg via ORAL
  Filled 2020-01-07: qty 2

## 2020-01-07 MED ORDER — OXYCODONE-ACETAMINOPHEN 5-325 MG PO TABS: 1.0000 | ORAL_TABLET | ORAL | Status: DC | PRN

## 2020-01-07 MED ORDER — DIPHENHYDRAMINE HCL 50 MG/ML IJ SOLN: 12.5000 mg | INTRAMUSCULAR | Status: DC | PRN

## 2020-01-07 MED ORDER — ONDANSETRON HCL 4 MG/2ML IJ SOLN
INTRAMUSCULAR | Status: DC | PRN
  Administered 2020-01-07: 4 mg via INTRAVENOUS

## 2020-01-07 MED ORDER — SIMETHICONE 80 MG PO CHEW
80.0000 mg | CHEWABLE_TABLET | Freq: Three times a day (TID) | ORAL | Status: DC
  Administered 2020-01-07 – 2020-01-09 (×6): 80 mg via ORAL
  Filled 2020-01-07 (×6): qty 1

## 2020-01-07 MED ORDER — SIMETHICONE 80 MG PO CHEW
80.0000 mg | CHEWABLE_TABLET | ORAL | Status: DC
  Administered 2020-01-08: 80 mg via ORAL
  Filled 2020-01-07: qty 1

## 2020-01-07 MED ORDER — CEFAZOLIN SODIUM-DEXTROSE 2-4 GM/100ML-% IV SOLN
2.0000 g | INTRAVENOUS | Status: AC
  Administered 2020-01-07: 2 g via INTRAVENOUS

## 2020-01-07 MED ORDER — SCOPOLAMINE 1 MG/3DAYS TD PT72
1.0000 | MEDICATED_PATCH | Freq: Once | TRANSDERMAL | Status: DC
  Administered 2020-01-07: 1.5 mg via TRANSDERMAL
  Filled 2020-01-07: qty 1

## 2020-01-07 MED ORDER — OXYTOCIN 40 UNITS IN NORMAL SALINE INFUSION - SIMPLE MED: 2.5000 [IU]/h | INTRAVENOUS | Status: AC

## 2020-01-07 MED ORDER — ONDANSETRON HCL 4 MG/2ML IJ SOLN: 4.0000 mg | Freq: Three times a day (TID) | INTRAMUSCULAR | Status: DC | PRN

## 2020-01-07 MED ORDER — IBUPROFEN 800 MG PO TABS
800.0000 mg | ORAL_TABLET | Freq: Four times a day (QID) | ORAL | Status: DC
  Administered 2020-01-07 – 2020-01-09 (×7): 800 mg via ORAL
  Filled 2020-01-07 (×8): qty 1

## 2020-01-07 MED ORDER — WITCH HAZEL-GLYCERIN EX PADS: 1.0000 "application " | MEDICATED_PAD | CUTANEOUS | Status: DC | PRN

## 2020-01-07 MED ORDER — NALOXONE HCL 0.4 MG/ML IJ SOLN: 0.4000 mg | INTRAMUSCULAR | Status: DC | PRN

## 2020-01-07 MED ORDER — SENNOSIDES-DOCUSATE SODIUM 8.6-50 MG PO TABS
2.0000 | ORAL_TABLET | ORAL | Status: DC
  Administered 2020-01-07 – 2020-01-08 (×2): 2 via ORAL
  Filled 2020-01-07 (×2): qty 2

## 2020-01-07 SURGICAL SUPPLY — 37 items
ADH SKN CLS APL DERMABOND .7 (GAUZE/BANDAGES/DRESSINGS)
APL SKNCLS STERI-STRIP NONHPOA (GAUZE/BANDAGES/DRESSINGS) ×1
BENZOIN TINCTURE PRP APPL 2/3 (GAUZE/BANDAGES/DRESSINGS) ×2 IMPLANT
CHLORAPREP W/TINT 26ML (MISCELLANEOUS) ×2 IMPLANT
CLAMP CORD UMBIL (MISCELLANEOUS) IMPLANT
CLOSURE STERI STRIP 1/2 X4 (GAUZE/BANDAGES/DRESSINGS) ×1 IMPLANT
CLOTH BEACON ORANGE TIMEOUT ST (SAFETY) ×2 IMPLANT
DERMABOND ADVANCED (GAUZE/BANDAGES/DRESSINGS)
DERMABOND ADVANCED .7 DNX12 (GAUZE/BANDAGES/DRESSINGS) IMPLANT
DRSG OPSITE POSTOP 4X10 (GAUZE/BANDAGES/DRESSINGS) ×2 IMPLANT
ELECT REM PT RETURN 9FT ADLT (ELECTROSURGICAL) ×2
ELECTRODE REM PT RTRN 9FT ADLT (ELECTROSURGICAL) ×1 IMPLANT
EXTRACTOR VACUUM KIWI (MISCELLANEOUS) IMPLANT
GLOVE BIO SURGEON STRL SZ 6 (GLOVE) ×2 IMPLANT
GLOVE BIOGEL PI IND STRL 6 (GLOVE) ×2 IMPLANT
GLOVE BIOGEL PI IND STRL 7.0 (GLOVE) ×1 IMPLANT
GLOVE BIOGEL PI INDICATOR 6 (GLOVE) ×2
GLOVE BIOGEL PI INDICATOR 7.0 (GLOVE) ×1
GOWN STRL REUS W/TWL LRG LVL3 (GOWN DISPOSABLE) ×4 IMPLANT
KIT ABG SYR 3ML LUER SLIP (SYRINGE) ×2 IMPLANT
NDL HYPO 25X5/8 SAFETYGLIDE (NEEDLE) ×1 IMPLANT
NEEDLE HYPO 25X5/8 SAFETYGLIDE (NEEDLE) ×2 IMPLANT
NS IRRIG 1000ML POUR BTL (IV SOLUTION) ×2 IMPLANT
PACK C SECTION WH (CUSTOM PROCEDURE TRAY) ×2 IMPLANT
PAD OB MATERNITY 4.3X12.25 (PERSONAL CARE ITEMS) ×2 IMPLANT
PENCIL SMOKE EVAC W/HOLSTER (ELECTROSURGICAL) ×2 IMPLANT
STRIP CLOSURE SKIN 1/2X4 (GAUZE/BANDAGES/DRESSINGS) IMPLANT
SUT CHROMIC 0 CTX 36 (SUTURE) ×6 IMPLANT
SUT MON AB 2-0 CT1 27 (SUTURE) ×2 IMPLANT
SUT PDS AB 0 CT1 27 (SUTURE) IMPLANT
SUT PDS AB 0 CTX 36 PDP370T (SUTURE) ×1 IMPLANT
SUT PLAIN 0 NONE (SUTURE) IMPLANT
SUT VIC AB 0 CT1 36 (SUTURE) IMPLANT
SUT VIC AB 4-0 KS 27 (SUTURE) IMPLANT
TOWEL OR 17X24 6PK STRL BLUE (TOWEL DISPOSABLE) ×2 IMPLANT
TRAY FOLEY W/BAG SLVR 14FR LF (SET/KITS/TRAYS/PACK) IMPLANT
WATER STERILE IRR 1000ML POUR (IV SOLUTION) ×2 IMPLANT

## 2020-01-07 NOTE — Progress Notes (Signed)
No change to H&P.  Lynn Woodford, DO 

## 2020-01-07 NOTE — Transfer of Care (Signed)
Immediate Anesthesia Transfer of Care Note  Patient: Lynn Bell  Procedure(s) Performed: CESAREAN SECTION (N/A )  Patient Location: PACU  Anesthesia Type:Spinal  Level of Consciousness: awake, alert , oriented and patient cooperative  Airway & Oxygen Therapy: Patient Spontanous Breathing  Post-op Assessment: Report given to RN and Post -op Vital signs reviewed and stable  Post vital signs: Reviewed and stable  Last Vitals:  Vitals Value Taken Time  BP 106/65 01/07/20 1000  Temp 36.5 C 01/07/20 0945  Pulse 74 01/07/20 1003  Resp 15 01/07/20 1003  SpO2 96 % 01/07/20 1003  Vitals shown include unvalidated device data.  Last Pain:  Vitals:   01/07/20 0945  TempSrc: Oral         Complications: No apparent anesthesia complications

## 2020-01-07 NOTE — Anesthesia Procedure Notes (Signed)
Spinal  Patient location during procedure: OR Start time: 01/07/2020 8:30 AM End time: 01/07/2020 8:35 AM Staffing Performed: anesthesiologist  Anesthesiologist: Pervis Hocking, DO Preanesthetic Checklist Completed: patient identified, IV checked, site marked, risks and benefits discussed, surgical consent, monitors and equipment checked, pre-op evaluation and timeout performed Spinal Block Patient position: sitting Prep: DuraPrep and site prepped and draped Patient monitoring: heart rate, cardiac monitor, continuous pulse ox and blood pressure Approach: midline Location: L3-4 Injection technique: single-shot Needle Needle type: Sprotte  Needle gauge: 24 G Needle length: 9 cm Assessment Sensory level: T4 Additional Notes Functioning IV was confirmed and monitors were applied. Sterile prep and drape, including hand hygiene and sterile gloves were used. The patient was positioned and the spine was prepped. The skin was anesthetized with lidocaine.  Free flow of clear CSF was obtained prior to injecting local anesthetic into the CSF.  The spinal needle aspirated freely following injection.  The needle was carefully withdrawn.  The patient tolerated the procedure well.

## 2020-01-07 NOTE — Op Note (Signed)
[name] PROCEDURE DATE: 01/07/2020  PREOPERATIVE DIAGNOSIS: Intrauterine pregnancy at  [redacted]w[redacted]d weeks gestation, gestational hypertension, previous cesarean delivery  POSTOPERATIVE DIAGNOSIS: The same  PROCEDURE:  Repeat Low Transverse Cesarean Section  SURGEON:  Dr. Linda Hedges  INDICATIONS: Lynn Bell is a 34 y.o. [phone] at [redacted]w[redacted]d scheduled for cesarean section secondary to gestational hypertension and previous C/S x 1.  The risks of cesarean section discussed with the patient included but were not limited to: bleeding which may require transfusion or reoperation; infection which may require antibiotics; injury to bowel, bladder, ureters or other surrounding organs; injury to the fetus; need for additional procedures including hysterectomy in the event of a life-threatening hemorrhage; placental abnormalities wth subsequent pregnancies, incisional problems, thromboembolic phenomenon and other postoperative/anesthesia complications. The patient concurred with the proposed plan, giving informed written consent for the procedure.    FINDINGS:  Viable female infant in cephalic presentation, APGARs 8,9: weight pending  Clear amniotic fluid.  Intact placenta, three vessel cord.  Grossly normal uterus, ovaries and fallopian tubes. .   ANESTHESIA: Spinal ESTIMATED BLOOD LOSS: 62 mL ml SPECIMENS: Placenta sent to L&D COMPLICATIONS: None immediate  PROCEDURE IN DETAIL:  The patient received intravenous antibiotics and had sequential compression devices applied to her lower extremities while in the preoperative area.  She was then taken to the operating room where spinal anesthesia was administered and was found to be adequate. She was then placed in a dorsal supine position with a leftward tilt, and prepped and draped in a sterile manner.  A foley catheter was placed into her bladder and attached to constant gravity.  After an adequate timeout was performed, a Pfannenstiel skin incision was made  with scalpel and carried through to the underlying layer of fascia. The fascia was incised in the midline and this incision was extended bilaterally using the Mayo scissors. Kocher clamps were applied to the superior aspect of the fascial incision and the underlying rectus muscles were dissected off bluntly. A similar process was carried out on the inferior aspect of the facial incision. The rectus muscles were separated in the midline bluntly and the peritoneum was entered bluntly.   A transverse hysterotomy was made with a scalpel and extended bilaterally bluntly. The bladder blade was then removed. The infant was successfully delivered, and cord was clamped and cut and infant was handed over to awaiting neonatology team. Uterine massage was then administered and the placenta delivered intact with three-vessel cord. The uterus was cleared of clot and debris.  The hysterotomy was closed with 0 chromic.  A second imbricating suture of 0-chromic was used to reinforce the incision and aid in hemostasis.  The peritoneum and rectus muscles were noted to be hemostatic and were reapproximated using 3-0 monocryl in a running fashion.  The fascia was closed with 0-PDS in a running fashion with good restoration of anatomy.  The subcutaneus tissue was copiously irrigated.  The skin was closed with 4-0 vicryl in a subcuticular fashion.  Pt tolerated the procedure will.  All counts were correct x2.  Pt went to the recovery room in stable condition.

## 2020-01-07 NOTE — Anesthesia Postprocedure Evaluation (Signed)
Anesthesia Post Note  Patient: Lynn Bell  Procedure(s) Performed: CESAREAN SECTION (N/A )     Patient location during evaluation: PACU Anesthesia Type: Spinal Level of consciousness: oriented and awake and alert Pain management: pain level controlled Vital Signs Assessment: post-procedure vital signs reviewed and stable Respiratory status: spontaneous breathing and respiratory function stable Cardiovascular status: blood pressure returned to baseline and stable Postop Assessment: no headache, no backache, no apparent nausea or vomiting and able to ambulate Anesthetic complications: no    Last Vitals:  Vitals:   01/07/20 0952 01/07/20 1013  BP:    Pulse: 89 73  Resp: 17 (!) 23  Temp:    SpO2: 96% 99%    Last Pain:  Vitals:   01/07/20 0945  TempSrc: Oral   Pain Goal:                Epidural/Spinal Function Cutaneous sensation: Tingles (01/07/20 1013), Patient able to flex knees: Yes (01/07/20 1013), Patient able to lift hips off bed: No (01/07/20 1013), Back pain beyond tenderness at insertion site: No (01/07/20 1013), Progressively worsening motor and/or sensory loss: No (01/07/20 1013), Bowel and/or bladder incontinence post epidural: No (01/07/20 [place])  Pervis Hocking

## 2020-01-08 LAB — CBC
HCT: 33.6 % — ABNORMAL LOW (ref 36.0–46.0)
Hemoglobin: 11 g/dL — ABNORMAL LOW (ref 12.0–15.0)
MCH: 30.6 pg (ref 26.0–34.0)
MCHC: 32.7 g/dL (ref 30.0–36.0)
MCV: 93.3 fL (ref 80.0–100.0)
Platelets: 178 10*3/uL (ref 150–400)
RBC: 3.6 MIL/uL — ABNORMAL LOW (ref 3.87–5.11)
RDW: 13.6 % (ref 11.5–15.5)
WBC: 9.7 10*3/uL (ref 4.0–10.5)
nRBC: 0 % (ref 0.0–0.2)

## 2020-01-08 LAB — BIRTH TISSUE RECOVERY COLLECTION (PLACENTA DONATION)

## 2020-01-08 NOTE — Lactation Note (Signed)
This note was copied from a baby's chart. Lactation Consultation Note  Patient Name: Lynn Bell [id number] Date: 01/08/2020 Reason for consult: Follow-up assessment;Nipple pain/trauma;Early term 37-38.6wks  LC in to visit with P2 Mom of ET infant at 61 hrs old.  Baby at 5.8% weight loss, output- 3 voids and 7 stools, baby has breastfed 6 X in last 24 hrs.    Mom holding baby in her arms STS.    Mom has abraded nipple tips, using coconut oil.  Baby woke and started to fuss, offered to assist/assess with positioning and latching.  Mom agreeable.   Mom using "Boppy Pillow" and cradle hold.  Mom bringing baby to breast before she opens her mouth widely.  Offered to help.   Assisted Mom in supporting baby higher, at breast height.  Assisted Mom in using cross cradle hold.  Mom needing some guidance on how to bring baby to breast quickly.  Showed FOB how to check for flanged lower lip.  Demonstrated a gentle chin tug to untuck lower lip.  Mom stated that latch was more comfortable.  Baby noted to be sucking/swallowing rhythmically and Mom taught to use alternate breast compression.    Encouraged Mom to call prn for assistance.  Encouraged Mom to hand express into spoon after feedings and offering baby extra colostrum.  Mom states she has done this.    Encouraged continued STS, offering breast often with cues.   Feeding Feeding Type: Breast Fed  LATCH Score Latch: Grasps breast easily, tongue down, lips flanged, rhythmical sucking.  Audible Swallowing: Spontaneous and intermittent  Type of Nipple: Everted at rest and after stimulation(short nipple shafts)  Comfort (Breast/Nipple): Filling, red/small blisters or bruises, mild/mod discomfort  Hold (Positioning): Assistance needed to correctly position infant at breast and maintain latch.  LATCH Score: 8  Interventions Interventions: Breast feeding basics reviewed;Assisted with latch;Skin to skin;Breast massage;Hand express;Breast  compression;Adjust position;Support pillows;Position options;Coconut oil;Hand pump;DEBP  Lactation Tools Discussed/Used Tools: Pump;Coconut oil;Shells Shell Type: Inverted Breast pump type: Double-Electric Breast Pump;Manual   Consult Status Consult Status: Follow-up Date: 01/09/20 Follow-up type: In-patient    Broadus John 01/08/2020, 6:59 PM

## 2020-01-08 NOTE — Progress Notes (Signed)
CSW received consult for hx of PPD, Anxiety and Depression.  CSW met with MOB to offer support and complete assessment.    When CSW arrived MOB was attending to infant and FOB was observing their interaction. CSW explained CSW's role and MOB gave CSW permission to complete the assessment while FOB was present. FOB did not engage with CSW however was attentive to infant and her cues.   CSW asked about MOB's MH hx and MOB openly shared having a dx of anxiety/depression.  MOB shared that she was dx while being a senior in high school. Per MOB, MOB has been "On/off with medication (Wellbutrin and Vistaril) since being dx.  MOB shared her last use of medication was February 2020. MOB also shared that her symptoms have been very minium since discontinuing medication while pregnancy. However, MOB reported that she recently found out that her mom has been dx with pancreatic cancer. MOB became tearful as she shared her moms dx.  MOB presented with insight and awareness and expressed feeling comfortable seeking help if help is needed.   CSW provided education regarding the baby blues period vs. perinatal mood disorders, discussed treatment and gave resources for mental health follow up if concerns arise.  CSW recommends self-evaluation during the postpartum time period using the New Mom Checklist from Postpartum Progress and encouraged MOB to contact a medical professional if symptoms are noted at any time. CSW assessed for safety and MOB denied SI and HI. MOB also shared having a good family support and will reach out to them if need.   CSW identifies no further need for intervention and no barriers to discharge at this time.  Emelina Hinch Boyd-Gilyard, MSW, LCSW Clinical Social Work [phone] ] 

## 2020-01-08 NOTE — Progress Notes (Signed)
Subjective: Postpartum Day 1: Cesarean Delivery repeat s/s gHTN Patient reports tolerating PO and no problems voiding.    Objective: Vital signs in last 24 hours: Temp:  [97.6 F (36.4 C)-98 F (36.7 C)] 97.7 F (36.5 C) (04/26 0523) Pulse Rate:  [59-91] 65 (04/26 0523) Resp:  [14-25] 18 (04/26 0523) BP: (97-120)/(53-73) 112/60 (04/26 0523) SpO2:  [95 %-100 %] 100 % (04/26 0523)  Physical Exam:  General: alert Lochia: appropriate Uterine Fundus: firm Incision: healing well DVT Evaluation: No evidence of DVT seen on physical exam.  Recent Labs    01/08/20 0537  HGB 11.0*  HCT 33.6*    Assessment/Plan: Status post Cesarean section. Doing well postoperatively. BP normal.  Continue current care.Baby girl doing well.    Tyson Dense 01/08/2020, 9:13 AM

## 2020-01-08 NOTE — Lactation Note (Signed)
This note was copied from a baby's chart. Lactation Consultation Note Baby 76 hrs old. Mom stated BF going well. Mom stated this baby is BF great compared to her son. Mom wasn't able to BF her sone d/t her wouldn't latch. Mom pump and bottle fed for 3-4 months.  Mom stated baby latches better to Rt. Nipple BF well on that breast. Has harder time latching to Lt. Nipple. Lt. Nipple flat, semi compressible. Gave mom hand pump for pre-pumping. Lt. Nipple everts some to a very short shaft nipple. Gave mom shells to wear in am. Gave mom hand pump for pre-pumping.  Mom shown how to use DEBP & how to disassemble, clean, & reassemble parts. Mom knows to pump q3h for 15-20 min. Mom encouraged to feed baby 8-12 times/24 hours and with feeding cues.  Mom encouraged to waken baby for feedings if baby hasn't cued in 3 hrs.   Encouraged support and comfort during feedings. Gave mom her boppy and positoned her pillows for her back and raised HOB up for back support.  Encouraged mom to call for assistance or questions. Lactation brochure given.  Patient Name: Girl Armetta Volta [id number] Date: 01/08/2020 Reason for consult: Initial assessment;Early term 37-38.6wks   Maternal Data Has patient been taught Hand Expression?: Yes Does the patient have breastfeeding experience prior to this delivery?: Yes  Feeding Feeding Type: Breast Fed  LATCH Score Latch: Repeated attempts needed to sustain latch, nipple held in mouth throughout feeding, stimulation needed to elicit sucking reflex.  Audible Swallowing: None  Type of Nipple: Flat(Lt. nipple flat/Rt. short shaft)  Comfort (Breast/Nipple): Soft / non-tender  Hold (Positioning): No assistance needed to correctly position infant at breast.  LATCH Score: 6  Interventions Interventions: Breast feeding basics reviewed;Support pillows;Position options;Breast massage;Hand express;Pre-pump if needed;Breast compression;Adjust position;DEBP;Hand  pump;Shells  Lactation Tools Discussed/Used Tools: Shells;Pump Shell Type: Inverted Breast pump type: Double-Electric Breast Pump;Manual WIC Program: No Pump Review: Setup, frequency, and cleaning;Milk Storage Initiated by:: Allayne Stack RN IBCLC Date initiated:: 01/08/20   Consult Status Consult Status: Follow-up Date: 01/08/20 Follow-up type: In-patient    Theodoro Kalata 01/08/2020, 12:37 AM

## 2020-01-09 MED ORDER — IBUPROFEN 800 MG PO TABS: 800.0000 mg | ORAL_TABLET | Freq: Four times a day (QID) | ORAL | 0 refills | Status: DC

## 2020-01-09 MED ORDER — OXYCODONE HCL 5 MG PO TABS: 5.0000 mg | ORAL_TABLET | ORAL | 0 refills | Status: DC | PRN

## 2020-01-09 NOTE — Discharge Summary (Signed)
Obstetric Discharge Summary Reason for Admission: cesarean section Prenatal Procedures: none Intrapartum Procedures: cesarean: low cervical, transverse Postpartum Procedures: none Complications-Operative and Postpartum: none Hemoglobin  Date Value Ref Range Status  01/08/2020 11.0 (L) 12.0 - 15.0 g/dL Final   HCT  Date Value Ref Range Status  01/08/2020 33.6 (L) 36.0 - 46.0 % Final    Physical Exam:  General: alert, cooperative, appears stated age and no distress Lochia: appropriate Uterine Fundus: firm Incision: healing well DVT Evaluation: No evidence of DVT seen on physical exam.  Discharge Diagnoses: Term Pregnancy-delivered  Discharge Information: Date: 01/09/2020 Activity: pelvic rest Diet: routine Medications: Ibuprofen and Percocet Condition: stable Instructions: refer to practice specific booklet Discharge to: home   Newborn Data: Live born female  Birth Weight: 7 lb 6.7 oz (3365 g) APGAR: 8, 9  Newborn Delivery   Birth date/time: 01/07/2021 08:55:00 Delivery type: C-Section, Low Transverse Trial of labor: No C-section categorization: Repeat      Home with mother.  Luz Lex 01/09/2020, 3:48 PM

## 2020-01-09 NOTE — Progress Notes (Signed)
Subjective: Postpartum Day 2: Cesarean Delivery Patient reports incisional pain, tolerating PO, + flatus, + BM and no problems voiding.    Objective: Vital signs in last 24 hours: Temp:  [98 F (36.7 C)-98.3 F (36.8 C)] 98.3 F (36.8 C) (04/27 0505) Pulse Rate:  [76-82] 76 (04/27 0505) Resp:  [18-20] 20 (04/27 0505) BP: (117-132)/(56-64) 125/64 (04/27 0505) SpO2:  [100 %] 100 % (04/27 0505)  Physical Exam:  General: alert, cooperative, appears stated age and no distress Lochia: appropriate Uterine Fundus: firm Incision: healing well DVT Evaluation: No evidence of DVT seen on physical exam.  Recent Labs    01/08/20 0537  HGB 11.0*  HCT 33.6*    Assessment/Plan: Status post Cesarean section. Doing well postoperatively.  Continue current care Anticipate tomorrow.  Today if baby doing well (Mom on oxycodone and motrin).  Luz Lex 01/09/2020, 8:17 AM

## 2020-01-09 NOTE — Lactation Note (Signed)
This note was copied from a baby's chart. Lactation Consultation Note  Patient Name: Lynn Bell [id number] Date: 01/09/2020 Reason for consult: Follow-up assessment  P2 mother whose infant is now 32 hours old.  This is an ETI at 37+0 weeks with a 10% weight loss this a.m.    Mother stated that her daughter is feeding well, however, her nipples are sore.  She has been advised on breast/nipple care and has practiced latching baby more deeply into breast tissue.  Pediatrician has rounded this a.m. and has asked parents to supplement with 30 mls of formula or breast milk after breast feeding.  Mother has chosen Similac 20 calorie and baby consumed 30 mls at 0900.  In discussing the feeding plan with mother I learned that she was pumping before breast feeding.  I advised her to breast feed first at 1200, allow father to supplement with at least 30 mls of formula (or more if baby desires) while mother pumps with the DEBP for 15 minutes.  Parents in agreement with this plan.    Discussed continuing this routine throughout the day/night until baby returns for her first pediatric visit tomorrow.  Mother has been taught hand expression and breast compressions.  Family feels confident they can follow through with this plan at home.  I believe they will continue the established plan throughout the day/night.  Mother has a DEBP for home use.  Engorgement prevention/treatment reviewed.  Mother has a manual pump and is familiar with its use.  She has our OP phone number for questions/concerns after discharge and I explained how she can call our office or make an OP visit with Ivin Booty as needed.  RN updated.   Maternal Data    Feeding Feeding Type: Formula Nipple Type: Slow - flow  LATCH Score                   Interventions    Lactation Tools Discussed/Used     Consult Status Consult Status: Complete Date: 01/09/20 Follow-up type: Call as needed    Katriona Schmierer R Kellee Sittner 01/09/2020,  10:53 AM

## 2020-01-28 ENCOUNTER — Inpatient Hospital Stay (HOSPITAL_COMMUNITY): Admit: 2020-01-28 | Payer: Self-pay

## 2020-03-06 ENCOUNTER — Encounter (HOSPITAL_BASED_OUTPATIENT_CLINIC_OR_DEPARTMENT_OTHER): Payer: Self-pay

## 2020-03-06 ENCOUNTER — Other Ambulatory Visit: Payer: Self-pay

## 2020-03-06 DIAGNOSIS — Z91040 Latex allergy status: Secondary | ICD-10-CM | POA: Insufficient documentation

## 2020-03-06 DIAGNOSIS — B349 Viral infection, unspecified: Secondary | ICD-10-CM | POA: Insufficient documentation

## 2020-03-06 DIAGNOSIS — Z87891 Personal history of nicotine dependence: Secondary | ICD-10-CM | POA: Diagnosis not present

## 2020-03-06 DIAGNOSIS — R509 Fever, unspecified: Secondary | ICD-10-CM | POA: Diagnosis present

## 2020-03-06 LAB — COMPREHENSIVE METABOLIC PANEL
ALT: 34 U/L (ref 0–44)
AST: 25 U/L (ref 15–41)
Albumin: 4.5 g/dL (ref 3.5–5.0)
Alkaline Phosphatase: 70 U/L (ref 38–126)
Anion gap: 10 (ref 5–15)
BUN: 15 mg/dL (ref 6–20)
CO2: 22 mmol/L (ref 22–32)
Calcium: 8.6 mg/dL — ABNORMAL LOW (ref 8.9–10.3)
Chloride: 103 mmol/L (ref 98–111)
Creatinine, Ser: 0.77 mg/dL (ref 0.44–1.00)
GFR calc Af Amer: >60 mL/min (ref >60)
GFR calc non Af Amer: >60 mL/min (ref >60)
Glucose, Bld: 99 mg/dL (ref 70–99)
Potassium: 3.7 mmol/L (ref 3.5–5.1)
Sodium: 135 mmol/L (ref 135–145)
Total Bilirubin: 0.7 mg/dL (ref 0.3–1.2)
Total Protein: 7.6 g/dL (ref 6.5–8.1)

## 2020-03-06 LAB — CBC WITH DIFFERENTIAL/PLATELET
Abs Immature Granulocytes: 0.03 10*3/uL (ref 0.00–0.07)
Basophils Absolute: 0 10*3/uL (ref 0.0–0.1)
Basophils Relative: 0 %
Eosinophils Absolute: 0 10*3/uL (ref 0.0–0.5)
Eosinophils Relative: 0 %
HCT: 41.7 % (ref 36.0–46.0)
Hemoglobin: 13.5 g/dL (ref 12.0–15.0)
Immature Granulocytes: 0 %
Lymphocytes Relative: 8 %
Lymphs Abs: 0.6 10*3/uL — ABNORMAL LOW (ref 0.7–4.0)
MCH: 29 pg (ref 26.0–34.0)
MCHC: 32.4 g/dL (ref 30.0–36.0)
MCV: 89.5 fL (ref 80.0–100.0)
Monocytes Absolute: 0.5 10*3/uL (ref 0.1–1.0)
Monocytes Relative: 6 %
Neutro Abs: 6.4 10*3/uL (ref 1.7–7.7)
Neutrophils Relative %: 86 %
Platelets: 209 10*3/uL (ref 150–400)
RBC: 4.66 MIL/uL (ref 3.87–5.11)
RDW: 12.8 % (ref 11.5–15.5)
WBC: 7.5 10*3/uL (ref 4.0–10.5)
nRBC: 0 % (ref 0.0–0.2)

## 2020-03-06 LAB — URINALYSIS, ROUTINE W REFLEX MICROSCOPIC
Bilirubin Urine: NEGATIVE
Glucose, UA: NEGATIVE mg/dL
Ketones, ur: NEGATIVE mg/dL
Leukocytes,Ua: NEGATIVE
Nitrite: NEGATIVE
Protein, ur: NEGATIVE mg/dL
Specific Gravity, Urine: >1.03 — ABNORMAL HIGH (ref 1.005–1.030)
pH: 5.5 (ref 5.0–8.0)

## 2020-03-06 LAB — LACTIC ACID, PLASMA: Lactic Acid, Venous: 0.6 mmol/L (ref 0.5–1.9)

## 2020-03-06 LAB — PREGNANCY, URINE: Preg Test, Ur: NEGATIVE

## 2020-03-06 LAB — URINALYSIS, MICROSCOPIC (REFLEX)

## 2020-03-06 MED ORDER — SODIUM CHLORIDE 0.9% FLUSH
3.0000 mL | Freq: Once | INTRAVENOUS | Status: DC
  Filled 2020-03-06: qty 3

## 2020-03-06 NOTE — ED Triage Notes (Signed)
Pt c/o fever, body aches x today- pain to csection incision site yesterday-csection on 4/25-NAD-steady gait

## 2020-03-07 ENCOUNTER — Emergency Department (HOSPITAL_BASED_OUTPATIENT_CLINIC_OR_DEPARTMENT_OTHER)
Admission: EM | Admit: 2020-03-07 | Discharge: 2020-03-07 | Disposition: A | Discharge status: Home / Self Care | Payer: 59 | Attending: Emergency Medicine | Admitting: Emergency Medicine

## 2020-03-07 DIAGNOSIS — B349 Viral infection, unspecified: Secondary | ICD-10-CM

## 2020-03-07 MED ORDER — ACETAMINOPHEN 325 MG PO TABS
650.0000 mg | ORAL_TABLET | Freq: Once | ORAL | Status: AC | PRN
  Administered 2020-03-07: 650 mg via ORAL
  Filled 2020-03-07: qty 2

## 2020-03-07 MED ORDER — KETOROLAC TROMETHAMINE 15 MG/ML IJ SOLN
15.0000 mg | Freq: Once | INTRAMUSCULAR | Status: AC
  Administered 2020-03-07: 15 mg via INTRAVENOUS
  Filled 2020-03-07: qty 1

## 2020-03-07 NOTE — ED Notes (Addendum)
Pt states sudden onset of lower abd pain radiating to back at 10 am 6/23. Denies n/v/d.Denies Sore throat, cough or dysuria. C/o body aches. Pt breast feeding, c-section 4/21. Incision site healed. No rebound tenderness with palp of abd.

## 2020-03-07 NOTE — ED Provider Notes (Addendum)
[facility] Provider Note: Georgena Spurling, MD, FACEP  CSN: [id number] MRN: [id number] ARRIVAL: 03/06/20 at 2136 ROOM: [facility]  [place]   HISTORY OF PRESENT ILLNESS  03/07/20 3:08 AM Lynn Bell is a 34 y.o. female who began feeling sick the day before yesterday.  Specifically she has had fever body aches, low back pain and abdominal pain.  The abdominal pain has been protean but primarily right lower quadrant and at her C-section site from April 2021.  She described it as burning in nature.  It is not currently present.  She denies nasal congestion, sore throat, cough, shortness of breath, nausea, vomiting, diarrhea or dysuria.  She had Covid in November 2020.  Her fever was noted to be as high as 101.4 here and she was given acetaminophen about an hour ago.  She had taken acetaminophen at home yesterday afternoon.  She has otherwise not been taking medication.  She continues to nurse her infant.   Past Medical History:  Diagnosis Date  . Anxiety   . Cardiac arrhythmia   . Depression    PPD  . History of chicken pox   . Pregnancy induced hypertension   . UTI (lower urinary tract infection)     Past Surgical History:  Procedure Laterality Date  . CESAREAN SECTION N/A 06/01/2017   Procedure: CESAREAN SECTION;  Surgeon: Everlene Farrier, MD;  Location: [place];  Service: Obstetrics;  Laterality: N/A;  . CESAREAN SECTION N/A 01/07/2020   Procedure: CESAREAN SECTION;  Surgeon: Linda Hedges, DO;  [facility]: [facility];  Service: Obstetrics;  Laterality: N/A;  REPEAT EDC 01/28/20 NKDA NEED RNFA  . TONSILLECTOMY      Family History  Problem Relation Age of Onset  . Breast cancer Maternal Grandmother        possibly paternal also  . Hyperlipidemia Father   . Heart disease Father   . Hypertension Father   . Hyperlipidemia Mother   . Heart disease Mother   . Hypertension Mother   . Pancreatic cancer Mother   . Kidney disease Paternal  Grandfather   . Cancer Maternal Grandfather        kidney removal due to mass    Social History   Tobacco Use  . Smoking status: Former Smoker    Packs/day: 0.25    Types: Cigarettes  . Smokeless tobacco: Never Used  Vaping Use  . Vaping Use: Never used  Substance Use Topics  . Alcohol use: Yes    Comment: weekly  . Drug use: No    Prior to Admission medications   Medication Sig Start Date End Date Taking? Authorizing Provider  ibuprofen (ADVIL) 800 MG tablet Take 1 tablet (800 mg total) by mouth 4 (four) times daily. 01/09/20   Louretta Shorten, MD  norethindrone (MICRONOR) 0.35 MG tablet Take 1 tablet by mouth daily. 02/28/20   [provider]    Allergies Latex   REVIEW OF SYSTEMS  Negative except as noted here or in the History of Present Illness.   PHYSICAL EXAMINATION  Initial Vital Signs Blood pressure 129/85, pulse 95, temperature (!) 101.4 F (38.6 C), temperature source Oral, resp. rate (!) 22, height 5\' 4"  (1.626 m), weight 74.8 kg, last menstrual period 04/23/2019, SpO2 99 %, currently breastfeeding.  Examination General: Well-developed, well-nourished female in no acute distress; appearance consistent with age of record HENT: normocephalic; atraumatic; no nasal congestion; no pharyngeal erythema or exudates; right TM normal, left TM obscured by cerumen  Eyes: pupils equal, round and reactive to light; extraocular muscles intact Neck: supple Heart: regular rate and rhythm; no murmur heard Lungs: clear to auscultation bilaterally Abdomen: soft; nondistended; nontender; no masses or hepatosplenomegaly; bowel sounds present; well-healing low transverse incision without signs of infection Extremities: No deformity; full range of motion; pulses normal Neurologic: Awake, alert and oriented; motor function intact in all extremities and symmetric; no facial droop Skin: Warm and dry Psychiatric: Normal mood and affect   RESULTS  Summary of this visit's  results, reviewed and interpreted by myself:   EKG Interpretation  Date/Time:    Ventricular Rate:    PR Interval:    QRS Duration:   QT Interval:    QTC Calculation:   R Axis:     Text Interpretation:        Laboratory Studies: Results for orders placed or performed during the hospital encounter of 03/07/20 (from the past 24 hour(s))  Urinalysis, Routine w reflex microscopic     Status: Abnormal   Collection Time: 03/06/20 10:19 PM  Result Value Ref Range   Color, Urine YELLOW YELLOW   APPearance CLEAR CLEAR   Specific Gravity, Urine >1.030 (H) 1.005 - 1.030   pH 5.5 5.0 - 8.0   Glucose, UA NEGATIVE NEGATIVE mg/dL   Hgb urine dipstick SMALL (A) NEGATIVE   Bilirubin Urine NEGATIVE NEGATIVE   Ketones, ur NEGATIVE NEGATIVE mg/dL   Protein, ur NEGATIVE NEGATIVE mg/dL   Nitrite NEGATIVE NEGATIVE   Leukocytes,Ua NEGATIVE NEGATIVE  Pregnancy, urine     Status: None   Collection Time: 03/06/20 10:19 PM  Result Value Ref Range   Preg Test, Ur NEGATIVE NEGATIVE  Urinalysis, Microscopic (reflex)     Status: Abnormal   Collection Time: 03/06/20 10:19 PM  Result Value Ref Range   RBC / HPF 0-5 0 - 5 RBC/hpf   WBC, UA 0-5 0 - 5 WBC/hpf   Bacteria, UA FEW (A) NONE SEEN   Squamous Epithelial / LPF 0-5 0 - 5  CBC with Differential     Status: Abnormal   Collection Time: 03/06/20 10:47 PM  Result Value Ref Range   WBC 7.5 4.0 - 10.5 K/uL   RBC 4.66 3.87 - 5.11 MIL/uL   Hemoglobin 13.5 12.0 - 15.0 g/dL   HCT 41.7 36 - 46 %   MCV 89.5 80.0 - 100.0 fL   MCH 29.0 26.0 - 34.0 pg   MCHC 32.4 30.0 - 36.0 g/dL   RDW 12.8 11.5 - 15.5 %   Platelets 209 150 - 400 K/uL   nRBC 0.0 0.0 - 0.2 %   Neutrophils Relative % 86 %   Neutro Abs 6.4 1.7 - 7.7 K/uL   Lymphocytes Relative 8 %   Lymphs Abs 0.6 (L) 0.7 - 4.0 K/uL   Monocytes Relative 6 %   Monocytes Absolute 0.5 0 - 1 K/uL   Eosinophils Relative 0 %   Eosinophils Absolute 0.0 0 - 0 K/uL   Basophils Relative 0 %   Basophils  Absolute 0.0 0 - 0 K/uL   Immature Granulocytes 0 %   Abs Immature Granulocytes 0.03 0.00 - 0.07 K/uL  Comprehensive metabolic panel     Status: Abnormal   Collection Time: 03/06/20 10:47 PM  Result Value Ref Range   Sodium 135 135 - 145 mmol/L   Potassium 3.7 3.5 - 5.1 mmol/L   Chloride 103 98 - 111 mmol/L   CO2 22 22 - 32 mmol/L   Glucose, Bld 99 70 -  99 mg/dL   BUN 15 6 - 20 mg/dL   Creatinine, Ser 0.77 0.44 - 1.00 mg/dL   Calcium 8.6 (L) 8.9 - 10.3 mg/dL   Total Protein 7.6 6.5 - 8.1 g/dL   Albumin 4.5 3.5 - 5.0 g/dL   AST 25 15 - 41 U/L   ALT 34 0 - 44 U/L   Alkaline Phosphatase 70 38 - 126 U/L   Total Bilirubin 0.7 0.3 - 1.2 mg/dL   GFR calc non Af Amer >60 >60 mL/min   GFR calc Af Amer >60 >60 mL/min   Anion gap 10 5 - 15  Lactic acid, plasma     Status: None   Collection Time: 03/06/20 10:47 PM  Result Value Ref Range   Lactic Acid, Venous 0.6 0.5 - 1.9 mmol/L   Imaging Studies: No results found.  ED COURSE and MDM  Nursing notes, initial and subsequent vitals signs, including pulse oximetry, reviewed and interpreted by myself.  Vitals:   03/06/20 2203 03/06/20 2204 03/07/20 0149  BP: 138/86  129/85  Pulse: (!) 125  95  Resp: 18  (!) 22  Temp: (!) 100.4 F (38 C)  (!) 101.4 F (38.6 C)  TempSrc: Oral  Oral  SpO2: 100%  99%  Weight:  74.8 kg   Height:  5\' 4"  (1.626 m)    Medications  sodium chloride flush (NS) 0.9 % injection 3 mL (3 mLs Intravenous Not Given 03/07/20 0150)  acetaminophen (TYLENOL) tablet 650 mg (650 mg Oral Given 03/07/20 0207)   The cause of the patient's illness is not clear at this time although a viral illness would be the most likely etiology.  Her low lymphocyte count is consistent with this.  Her lactate is normal.  She was advised to continue ibuprofen and/or acetaminophen as needed for fever or body aches and return should symptoms worsen, especially localized abdominal pain, difficulty breathing, dysuria or sore  throat.   PROCEDURES  Procedures   ED DIAGNOSES     ICD-10-CM   1. Viral illness  B34.9        Kieren Adkison, MD 03/07/20 0325    Shanon Rosser, MD 03/07/20 [phone]

## 2020-05-13 ENCOUNTER — Encounter: Payer: Self-pay | Admitting: Family Medicine

## 2020-05-13 ENCOUNTER — Other Ambulatory Visit: Payer: Self-pay

## 2020-05-13 ENCOUNTER — Ambulatory Visit: Payer: 59 | Admitting: Family Medicine

## 2020-05-13 VITALS — BP 119/84 | HR 80 | Temp 97.8°F | Resp 16 | Ht 64.0 in | Wt 164.1 lb

## 2020-05-13 DIAGNOSIS — M545 Low back pain, unspecified: Secondary | ICD-10-CM

## 2020-05-13 DIAGNOSIS — M25552 Pain in left hip: Secondary | ICD-10-CM

## 2020-05-13 MED ORDER — IBUPROFEN 800 MG PO TABS: 800.0000 mg | ORAL_TABLET | Freq: Three times a day (TID) | ORAL | 0 refills | Status: DC | PRN

## 2020-05-13 NOTE — Progress Notes (Signed)
   Subjective:    Patient ID: Lynn Bell, female    DOB: 18-Jul-1986, 34 y.o.   MRN: [id number]  HPI Back/hip pain- 'I just feel like something's wrong'.  Had consistent back pain during 2nd pregnancy but this did not improve after delivery.  L>R.  Will have radiating pain/pressure 'from hip to hip.  Pain is lumbar, radiating into L hip.  Holds baby on L hip.  No radiation into legs.  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     Review of Systems For ROS see HPI   This visit occurred during the SARS-CoV-2 public health emergency.  Safety protocols were in place, including screening questions prior to the visit, additional usage of staff PPE, and extensive cleaning of exam room while observing appropriate contact time as indicated for disinfecting solutions.       Objective:   Physical Exam Vitals reviewed.  Constitutional:      General: She is not in acute distress.    Appearance: Normal appearance. She is not ill-appearing.  HENT:     Head: Normocephalic and atraumatic.  Musculoskeletal:     Comments: R leg appears to be longer than L when legs fully extended + 'good pain' w/ pelvic tilt forward No lumbar or gluteal spasm  Skin:    General: Skin is warm and dry.     Findings: No rash.  Neurological:     General: No focal deficit present.     Mental Status: She is alert and oriented to person, place, and time.     Cranial Nerves: No cranial nerve deficit.     Motor: No weakness.     Deep Tendon Reflexes: Reflexes normal.  Psychiatric:        Mood and Affect: Mood normal.        Behavior: Behavior normal.        Thought Content: Thought content normal.           Assessment & Plan:  Lumbar back pain/L hip pain- pt reports pain has been constant since prior to birth of daughter in April.  She had hoped pain would improve after delivery but this is not the case.  She has been told by her chiropractor that she has a leg length inequality and today, R leg seems  longer than L.  She also holds baby on L hip.  Start scheduled NSAIDs, and refer to Sports Med for complete evaluation.  Pt expressed understanding and is in agreement w/ plan.

## 2020-05-13 NOTE — Patient Instructions (Signed)
Follow up as needed or as scheduled START the Ibuprofen 3x/day- w/ food- for the next 10 days HEAT!! We'll call you with your Sports Med Referral Call with any questions or concerns Elbert Ewings in there!

## 2020-06-24 ENCOUNTER — Ambulatory Visit: Payer: 59 | Admitting: Family Medicine

## 2020-06-27 ENCOUNTER — Ambulatory Visit: Payer: 59 | Admitting: Family Medicine

## 2020-07-11 ENCOUNTER — Encounter: Payer: Self-pay | Admitting: Family Medicine

## 2020-07-11 ENCOUNTER — Ambulatory Visit: Payer: 59 | Admitting: Family Medicine

## 2020-07-11 ENCOUNTER — Other Ambulatory Visit: Payer: Self-pay

## 2020-07-11 VITALS — BP 120/80 | HR 88 | Ht 64.0 in | Wt 164.0 lb

## 2020-07-11 DIAGNOSIS — M999 Biomechanical lesion, unspecified: Secondary | ICD-10-CM | POA: Diagnosis not present

## 2020-07-11 DIAGNOSIS — M533 Sacrococcygeal disorders, not elsewhere classified: Secondary | ICD-10-CM

## 2020-07-11 NOTE — Assessment & Plan Note (Signed)
   Decision today to treat with OMT was based on Physical Exam  After verbal consent patient was treated with HVLA, ME, FPR techniques in cervical, thoracic, rib, lumbar and sacral areas,  Patient tolerated the procedure well with improvement in symptoms  Patient given exercises, stretches and lifestyle modifications  See medications in patient instructions if given  Patient will follow up in 4-8 weeks

## 2020-07-11 NOTE — Progress Notes (Signed)
[place] Phone: [phone] Subjective:   [name], am serving as a scribe for Dr. Hulan Saas. This visit occurred during the SARS-CoV-2 public health emergency.  Safety protocols were in place, including screening questions prior to the visit, additional usage of staff PPE, and extensive cleaning of exam room while observing appropriate contact time as indicated for disinfecting solutions.   I'm seeing this patient by the request  of:  Midge Minium, MD  CC: Back pain and hip pain follow-up  [id number]  Lynn Bell is a 34 y.o. female coming in with complaint of low back pain and left hip pain. Patient states that she is having left side low back and hip pain since having her 2 children. Pain in left SI that radiates into the anterior aspect of hip. Pain with sitting and lying down. Has tried multiple different things over the course of time.  Patient was seen a chiropractor during the pregnancy but then had preeclampsia and discontinued.  States that it is a chronic discomfort that is always noted.    Past Medical History:  Diagnosis Date  . Anxiety   . Cardiac arrhythmia   . Depression    PPD  . History of chicken pox   . Pregnancy induced hypertension   . UTI (lower urinary tract infection)    Past Surgical History:  Procedure Laterality Date  . CESAREAN SECTION N/A 06/01/2017   Procedure: CESAREAN SECTION;  Surgeon: Everlene Farrier, MD;  Location: [facility];  Service: Obstetrics;  Laterality: N/A;  . CESAREAN SECTION N/A 01/07/2020   Procedure: CESAREAN SECTION;  Surgeon: Linda Hedges, DO;  Location: [facility];  Service: Obstetrics;  Laterality: N/A;  REPEAT EDC 01/28/20 NKDA NEED RNFA  . TONSILLECTOMY     Social History   Socioeconomic History  . Marital status: Married    Spouse name: Not on file  . Number of children: Not on file  . Years of education: Not on file    . Highest education level: Not on file  Occupational History  . Not on file  Tobacco Use  . Smoking status: Former Smoker    Packs/day: 0.25    Types: Cigarettes  . Smokeless tobacco: Never Used  Vaping Use  . Vaping Use: Never used  Substance and Sexual Activity  . Alcohol use: Yes    Comment: weekly  . Drug use: No  . Sexual activity: Not on file  Other Topics Concern  . Not on file  Social History Narrative  . Not on file   Social Determinants of Health   Financial Resource Strain:   . Difficulty of Paying Living Expenses: Not on file  Food Insecurity:   . Worried About Charity fundraiser in the Last Year: Not on file  . Ran Out of Food in the Last Year: Not on file  Transportation Needs:   . Lack of Transportation (Medical): Not on file  . Lack of Transportation (Non-Medical): Not on file  Physical Activity:   . Days of Exercise per Week: Not on file  . Minutes of Exercise per Session: Not on file  Stress:   . Feeling of Stress : Not on file  Social Connections:   . Frequency of Communication with Friends and Family: Not on file  . Frequency of Social Gatherings with Friends and Family: Not on file  . Attends Religious Services: Not on file  . Active Member  of Clubs or Organizations: Not on file  . Attends Archivist Meetings: Not on file  . Marital Status: Not on file   Allergies  Allergen Reactions  . Latex     Pt "prefers" no latex, but not an allergy Sensitive skin   Family History  Problem Relation Age of Onset  . Breast cancer Maternal Grandmother        possibly paternal also  . Hyperlipidemia Father   . Heart disease Father   . Hypertension Father   . Hyperlipidemia Mother   . Heart disease Mother   . Hypertension Mother   . Pancreatic cancer Mother   . Kidney disease Paternal Grandfather   . Cancer Maternal Grandfather        kidney removal due to mass    Current Outpatient Medications (Endocrine & Metabolic):  .   norethindrone (MICRONOR) 0.35 MG tablet, Take 1 tablet by mouth daily.    Current Outpatient Medications (Analgesics):  .  ibuprofen (ADVIL) 800 MG tablet, Take 1 tablet (800 mg total) by mouth 4 (four) times daily. Marland Kitchen  ibuprofen (ADVIL) 800 MG tablet, Take 1 tablet (800 mg total) by mouth every 8 (eight) hours as needed.     Reviewed prior external information including notes and imaging from  primary care provider As well as notes that were available from care everywhere and other healthcare systems.  Past medical history, social, surgical and family history all reviewed in electronic medical record.  No pertanent information unless stated regarding to the chief complaint.   Review of Systems:  No headache, visual changes, nausea, vomiting, diarrhea, constipation, dizziness, abdominal pain, skin rash, fevers, chills, night sweats, weight loss, swollen lymph nodes, body aches, joint swelling, chest pain, shortness of breath, mood changes. POSITIVE muscle aches  Objective  Blood pressure 120/80, pulse 88, height 5\' 4"  (1.626 m), weight 164 lb (74.4 kg), SpO2 99 %, not currently breastfeeding.   General: No apparent distress alert and oriented x3 mood and affect normal, dressed appropriately.  HEENT: Pupils equal, extraocular movements intact  Respiratory: Patient's speak in full sentences and does not appear short of breath  Cardiovascular: No lower extremity edema, non tender, no erythema  Neuro: Cranial nerves II through XII are intact, neurovascularly intact in all extremities with 2+ DTRs and 2+ pulses.  Gait normal with good balance and coordination.  MSK: Low back exam shows significant tightness noted in the lumbosacral area.  Tenderness to palpation over the left sacroiliac joint.  Mild positive Corky Sox on the left.  Negative straight leg test.  Patient does have good range of motion of the back otherwise.  5 out of 5 strength.    Osteopathic findings C7 flexed rotated and side  bent right T6 extended rotated and side bent left with inhaled rib L5 flexed rotated and side bent right Sacrum left on left    Impression and Recommendations:     The above documentation has been reviewed and is accurate and complete [name], DO

## 2020-07-11 NOTE — Patient Instructions (Addendum)
Exercises 3x a week Vit D 2000IU See me in 4-6 weeks

## 2020-07-11 NOTE — Assessment & Plan Note (Signed)
Sacroiliac dysfunction.  Discussed posture and ergonomics, discussed which activities to do which wants to avoid.  We discussed the core stability.  Work with Product/process development scientist.  No medications prescribed today but can take the ibuprofen if necessary.  Patient is going to increase activity and see me again in 4 to 6 weeks responded very well to osteopathic manipulation today.

## 2020-07-12 ENCOUNTER — Encounter: Payer: Self-pay | Admitting: Family Medicine

## 2020-07-12 ENCOUNTER — Telehealth (INDEPENDENT_AMBULATORY_CARE_PROVIDER_SITE_OTHER): Payer: 59 | Admitting: Family Medicine

## 2020-07-12 ENCOUNTER — Other Ambulatory Visit: Payer: Self-pay

## 2020-07-12 DIAGNOSIS — F419 Anxiety disorder, unspecified: Secondary | ICD-10-CM | POA: Diagnosis not present

## 2020-07-12 MED ORDER — BUPROPION HCL ER (XL) 150 MG PO TB24
150.0000 mg | ORAL_TABLET | Freq: Every day | ORAL | 3 refills | Status: DC
Start: 2020-07-12 — End: 2020-08-15

## 2020-07-12 NOTE — Progress Notes (Signed)
   Virtual Visit via Video   I connected with patient on 07/12/20 at  2:30 PM EDT by a video enabled telemedicine application and verified that I am speaking with the correct person using two identifiers.  Location patient: Home Location provider: Acup[facility]urist, Office Persons participating in the virtual visit: Patient, Provider, [place] (Jess B)  I discussed the limitations of evaluation and management by telemedicine and the availability of in person appointments. The patient expressed understanding and agreed to proceed.  Subjective:   HPI:   Anxiety- mom is currently going through chemo for advanced pancreatic cancer.  Has previously struggled w/ anxiety and depression and feels that she needs something to help get though this.  Having good days and bad days.  Has a 42 month old to care for and a 35 yr old.  Is overwhelmed and feels like she is pulled in every direction but not managing any of them well.  Pt is having low energy.  In the past she has been successful w/ Wellbutrin.  ROS:   See pertinent positives and negatives per HPI.  Patient Active Problem List   Diagnosis Date Noted  . SI (sacroiliac) joint dysfunction 07/11/2020  . Nonallopathic lesion of sacral region 07/11/2020  . Gestational hypertension 01/07/2020  . Previous cesarean section 01/07/2020  . Gout 10/07/2018  . Pregnant 05/30/2017  . Birth control counseling 03/30/2016  . Psoriasis 03/30/2016  . Physical exam 08/30/2015  . UTI (urinary tract infection) 05/13/2014  . Family history of early CAD 01/09/2011  . Eczema of hand 01/09/2011  . Anxiety 01/09/2011    Social History   Tobacco Use  . Smoking status: Former Smoker    Packs/day: 0.25    Types: Cigarettes  . Smokeless tobacco: Never Used  Substance Use Topics  . Alcohol use: Yes    Comment: weekly    Current Outpatient Medications:  .  ibuprofen (ADVIL) 800 MG tablet, Take 1 tablet (800 mg total) by mouth every 8 (eight) hours as needed.  (Patient not taking: Reported on 07/12/2020), Disp: 45 tablet, Rfl: 0  Allergies  Allergen Reactions  . Latex     Pt "prefers" no latex, but not an allergy Sensitive skin    Objective:   Breastfeeding No  AAOx3, NAD NCAT, EOMI No obvious CN deficits Coloring WNL Pt is able to speak clearly, coherently without shortness of breath or increased work of breathing.  Thought process is linear.  Mood is appropriate.   Assessment and Plan:   Anxiety- deteriorated.  Pt has hx of this but recently sxs have been worse as she tries to balance everything- new baby, 34 yr old, working, mom's cancer dx.  She is irritable, tired, low energy/motivation, intermittently tearful.  She has done well previously on Wellbutrin.  Will restart and follow closely.  Pt expressed understanding and is in agreement w/ plan.   Annye Asa, MD 07/12/2020

## 2020-07-12 NOTE — Progress Notes (Signed)
I have discussed the procedure for the virtual visit with the patient who has given consent to proceed with assessment and treatment.   Pt unable to obtain vitals.   Jaber Dunlow L Gale Hulse, CMA     

## 2020-07-17 ENCOUNTER — Telehealth: Payer: Self-pay | Admitting: Family Medicine

## 2020-07-17 NOTE — Telephone Encounter (Signed)
LM asking pt to call back to schedule an 1 mth reck Anxiety with Tabori from her appt on 07/12/20

## 2020-08-12 ENCOUNTER — Ambulatory Visit: Payer: 59 | Admitting: Family Medicine

## 2020-08-13 ENCOUNTER — Ambulatory Visit: Payer: 59 | Admitting: Family Medicine

## 2020-08-13 NOTE — Progress Notes (Deleted)
  [place] Phone: [phone] Subjective:    I'm seeing this patient by the request  of:  Midge Minium, MD  CC:   [id number]  Marwa Fuhrman is a 34 y.o. female coming in with complaint of back and neck pain. OMT 07/11/2020. Patient states   Medications patient has been prescribed:   Taking:         Reviewed prior external information including notes and imaging from previsou exam, outside providers and external EMR if available.   As well as notes that were available from care everywhere and other healthcare systems.  Past medical history, social, surgical and family history all reviewed in electronic medical record.  No pertanent information unless stated regarding to the chief complaint.   Past Medical History:  Diagnosis Date  . Anxiety   . Cardiac arrhythmia   . Depression    PPD  . History of chicken pox   . Pregnancy induced hypertension   . UTI (lower urinary tract infection)     Allergies  Allergen Reactions  . Latex     Pt "prefers" no latex, but not an allergy Sensitive skin     Review of Systems:  No headache, visual changes, nausea, vomiting, diarrhea, constipation, dizziness, abdominal pain, skin rash, fevers, chills, night sweats, weight loss, swollen lymph nodes, body aches, joint swelling, chest pain, shortness of breath, mood changes. POSITIVE muscle aches  Objective  not currently breastfeeding.   General: No apparent distress alert and oriented x3 mood and affect normal, dressed appropriately.  HEENT: Pupils equal, extraocular movements intact  Respiratory: Patient's speak in full sentences and does not appear short of breath  Cardiovascular: No lower extremity edema, non tender, no erythema  Neuro: Cranial nerves II through XII are intact, neurovascularly intact in all extremities with 2+ DTRs and 2+ pulses.  Gait normal with good balance and coordination.   MSK:  Non tender with full range of motion and good stability and symmetric strength and tone of shoulders, elbows, wrist, hip, knee and ankles bilaterally.  Back - Normal skin, Spine with normal alignment and no deformity.  No tenderness to vertebral process palpation.  Paraspinous muscles are not tender and without spasm.   Range of motion is full at neck and lumbar sacral regions  Osteopathic findings  C2 flexed rotated and side bent right C6 flexed rotated and side bent left T3 extended rotated and side bent right inhaled rib T9 extended rotated and side bent left L2 flexed rotated and side bent right Sacrum right on right       Assessment and Plan:    Nonallopathic problems  Decision today to treat with OMT was based on Physical Exam  After verbal consent patient was treated with HVLA, ME, FPR techniques in cervical, rib, thoracic, lumbar, and sacral  areas  Patient tolerated the procedure well with improvement in symptoms  Patient given exercises, stretches and lifestyle modifications  See medications in patient instructions if given  Patient will follow up in 4-8 weeks      The above documentation has been reviewed and is accurate and complete [name]       Note: This dictation was prepared with Dragon dictation along with smaller phrase technology. Any transcriptional errors that result from this process are unintentional.

## 2020-08-15 ENCOUNTER — Other Ambulatory Visit: Payer: Self-pay

## 2020-08-15 ENCOUNTER — Encounter: Payer: Self-pay | Admitting: Family Medicine

## 2020-08-15 ENCOUNTER — Telehealth (INDEPENDENT_AMBULATORY_CARE_PROVIDER_SITE_OTHER): Payer: 59 | Admitting: Family Medicine

## 2020-08-15 DIAGNOSIS — F419 Anxiety disorder, unspecified: Secondary | ICD-10-CM

## 2020-08-15 MED ORDER — BUPROPION HCL ER (XL) 300 MG PO TB24: 300.0000 mg | ORAL_TABLET | Freq: Every day | ORAL | 0 refills | Status: DC

## 2020-08-15 NOTE — Addendum Note (Signed)
Addended by: Midge Minium on: 08/15/2020 09:03 AM   Modules accepted: Orders

## 2020-08-15 NOTE — Progress Notes (Signed)
° °  Virtual Visit via Video   I connected with patient on 08/15/20 at  1:00 PM EST by a video enabled telemedicine application and verified that I am speaking with the correct person using two identifiers.  Location patient: Home Location provider: Acupuncturist, Office Persons participating in the virtual visit: Patient, Provider, [place] (Katie N)  I discussed the limitations of evaluation and management by telemedicine and the availability of in person appointments. The patient expressed understanding and agreed to proceed.  Subjective:   HPI:   Anxiety- Pt was started on Wellbutrin 150mg  daily at last visit.  She feels like things have improved but they could be better.  She continues to struggle w/ bad days in which she feels totally overwhelmed.  Open to idea of increasing Wellbutrin.  ROS:   See pertinent positives and negatives per HPI.  Patient Active Problem List   Diagnosis Date Noted   SI (sacroiliac) joint dysfunction 07/11/2020   Nonallopathic lesion of sacral region 07/11/2020   Gestational hypertension 01/07/2020   Previous cesarean section 01/07/2020   Gout 10/07/2018   Pregnant 05/30/2017   Birth control counseling 03/30/2016   Psoriasis 03/30/2016   Physical exam 08/30/2015   UTI (urinary tract infection) 05/13/2014   Family history of early CAD 01/09/2011   Eczema of hand 01/09/2011   Anxiety 01/09/2011    Social History   Tobacco Use   Smoking status: Former Smoker    Packs/day: 0.25    Types: Cigarettes   Smokeless tobacco: Never Used  Substance Use Topics   Alcohol use: Yes    Comment: weekly    Current Outpatient Medications:    buPROPion (WELLBUTRIN XL) 150 MG 24 hr tablet, Take 1 tablet (150 mg total) by mouth daily., Disp: 30 tablet, Rfl: 3   ibuprofen (ADVIL) 800 MG tablet, Take 1 tablet (800 mg total) by mouth every 8 (eight) hours as needed. (Patient not taking: Reported on 07/12/2020), Disp: 45 tablet, Rfl:  0  Allergies  Allergen Reactions   Latex     Pt "prefers" no latex, but not an allergy Sensitive skin    Objective:   There were no vitals taken for this visit.  AAOx3, NAD NCAT, EOMI No obvious CN deficits Coloring WNL Pt is able to speak clearly, coherently without shortness of breath or increased work of breathing.  Thought process is linear.  Mood is appropriate.   Assessment and Plan:   Anxiety/Depression- improved but not yet under control.  Will increase Wellbutrin to 300mg  daily.  2 of her current prescription and 1 of the new medication.  Pt expressed understanding and is in agreement w/ plan.    Annye Asa, MD 08/15/2020

## 2020-08-16 ENCOUNTER — Encounter: Payer: Self-pay | Admitting: Family Medicine

## 2020-08-22 ENCOUNTER — Encounter: Payer: Self-pay | Admitting: Family Medicine

## 2020-08-22 ENCOUNTER — Ambulatory Visit: Payer: 59 | Admitting: Family Medicine

## 2020-08-22 ENCOUNTER — Other Ambulatory Visit: Payer: Self-pay

## 2020-08-22 VITALS — BP 140/90 | HR 87 | Ht 64.0 in | Wt 165.0 lb

## 2020-08-22 DIAGNOSIS — M999 Biomechanical lesion, unspecified: Secondary | ICD-10-CM | POA: Diagnosis not present

## 2020-08-22 DIAGNOSIS — M533 Sacrococcygeal disorders, not elsewhere classified: Secondary | ICD-10-CM

## 2020-08-22 MED ORDER — MELOXICAM 15 MG PO TABS
15.0000 mg | ORAL_TABLET | Freq: Every day | ORAL | 0 refills | Status: DC
Start: 2020-08-22 — End: 2020-09-20

## 2020-08-22 NOTE — Assessment & Plan Note (Signed)
Still seems to be more sacroiliac.  We will change patient's ibuprofen to meloxicam and do more and bursa formation.  Patient has done relatively well though and I do think is going to make improvement.  We discussed posture and ergonomics.  Discussed core strengthening.  Patient will follow up with me again 6 weeks.

## 2020-08-22 NOTE — Patient Instructions (Addendum)
Meloxicam 15mg  daily for 10 days Do not use NSAIDS such as Advil or Aleve when taking Meloxicam It is ok to use Tylenol for additional pain relief Then use in 3-5 day bursts See me in 5-6 weeks

## 2020-08-22 NOTE — Progress Notes (Signed)
[facility] [address] [place] Phone: [phone] Subjective:   I Lynn Bell am serving as a Education administrator for Dr. Hulan Bell.  This visit occurred during the SARS-CoV-2 public health emergency.  Safety protocols were in place, including screening questions prior to the visit, additional usage of staff PPE, and extensive cleaning of exam room while observing appropriate contact time as indicated for disinfecting solutions.   I'm seeing this patient by the request  of:  Lynn Minium, MD  CC: Low back pain follow-up  [id number]  Lynn Bell is a 34 y.o. female coming in with complaint of back and neck pain. OMT 07/11/2020. Patient states she felt immediate relief after the last appointment. Week of thanksgiving was bad. Has been taking vitamin D and stretching.  Patient states up to that point though she was doing relatively well.  Did feel that the manipulation was helpful.  Not doing the exercises quite as regularly as she should  Medications patient has been prescribed: None  She does intermittently take a prescription ibuprofen         Reviewed prior external information including notes and imaging from previsou exam, outside providers and external EMR if available.   As well as notes that were available from care everywhere and other healthcare systems.  Past medical history, social, surgical and family history all reviewed in electronic medical record.  No pertanent information unless stated regarding to the chief complaint.   Past Medical History:  Diagnosis Date  . Anxiety   . Cardiac arrhythmia   . Depression    PPD  . History of chicken pox   . Pregnancy induced hypertension   . UTI (lower urinary tract infection)     Allergies  Allergen Reactions  . Latex     Pt "prefers" no latex, but not an allergy Sensitive skin     Review of Systems:  No headache, visual changes, nausea, vomiting, diarrhea,  constipation, dizziness, abdominal pain, skin rash, fevers, chills, night sweats, weight loss, swollen lymph nodes, body aches, joint swelling, chest pain, shortness of breath, mood changes. POSITIVE muscle aches  Objective  not currently breastfeeding.   General: No apparent distress alert and oriented x3 mood and affect normal, dressed appropriately.  HEENT: Pupils equal, extraocular movements intact  Respiratory: Patient's speak in full sentences and does not appear short of breath  Cardiovascular: No lower extremity edema, non tender, no erythema  Neuro: Cranial nerves II through XII are intact, neurovascularly intact in all extremities with 2+ DTRs and 2+ pulses.  Gait normal with good balance and coordination.  MSK:  Non tender with full range of motion and good stability and symmetric strength and tone of shoulders, elbows, wrist, hip, knee and ankles bilaterally.  Back -low back exam still shows tightness of the sacroiliac joint.  Patient does have a very mild pelvic shear noted as well.  Tightness noted with Lynn Bell right greater than left.  Negative straight leg test.  Mild pain with extension of the back no radicular symptoms with 5 out of 5 strength in lower extremities  Osteopathic findings   T7 extended rotated and side bent left L4 flexed rotated and side bent left Sacrum right on right       Assessment and Plan:  SI (sacroiliac) joint dysfunction Still seems to be more sacroiliac.  We will change patient's ibuprofen to meloxicam and do more and bursa formation.  Patient has done relatively well though and I  do think is going to make improvement.  We discussed posture and ergonomics.  Discussed core strengthening.  Patient will follow up with me again 6 weeks.    Nonallopathic problems  Decision today to treat with OMT was based on Physical Exam  After verbal consent patient was treated with HVLA, ME, FPR techniques in  thoracic, lumbar, and sacral  areas  Patient  tolerated the procedure well with improvement in symptoms  Patient given exercises, stretches and lifestyle modifications  See medications in patient instructions if given  Patient will follow up in 4-8 weeks      The above documentation has been reviewed and is accurate and complete Lynn Bell       Note: This dictation was prepared with Dragon dictation along with smaller phrase technology. Any transcriptional errors that result from this process are unintentional.

## 2020-09-20 ENCOUNTER — Other Ambulatory Visit: Payer: Self-pay | Admitting: Family Medicine

## 2020-09-22 NOTE — Progress Notes (Unsigned)
L[place] Bell Phone: [phone] Subjective:   [name], am serving as a scribe for Dr. Hulan Saas. This visit occurred during the SARS-CoV-2 public health emergency.  Safety protocols were in place, including screening questions prior to the visit, additional usage of staff PPE, and extensive cleaning of exam room while observing appropriate contact time as indicated for disinfecting solutions.   I'm seeing this patient by the request  of:  Lynn Minium, MD  CC: Low back pain follow-up  [id number]  Lynn Bell is a 35 y.o. female coming in with complaint of back and neck pain. OMT 08/22/2020. Patient states that she has been uncomfortable on a few days. Does feel like OMT is helping to manage her pain. Pressure in hips intermittently.  Nothing that stops her from activity just more discomfort and pain from time to time.  Does feel like the manipulation has been the most helpful.  Has only used the meloxicam twice  Medications patient has been prescribed: Mobic  Taking: Intermittently         Reviewed prior external information including notes and imaging from previsou exam, outside providers and external EMR if available.   As well as notes that were available from care everywhere and other healthcare systems.  Past medical history, social, surgical and family history all reviewed in electronic medical record.  No pertanent information unless stated regarding to the chief complaint.   Past Medical History:  Diagnosis Date  . Anxiety   . Cardiac arrhythmia   . Depression    PPD  . History of chicken pox   . Pregnancy induced hypertension   . UTI (lower urinary tract infection)     Allergies  Allergen Reactions  . Latex     Pt "prefers" no latex, but not an allergy Sensitive skin     Review of Systems:  No headache, visual changes, nausea, vomiting, diarrhea, constipation,  dizziness, abdominal pain, skin rash, fevers, chills, night sweats, weight loss, swollen lymph nodes, body aches, joint swelling, chest pain, shortness of breath, mood changes. POSITIVE muscle aches  Objective  not currently breastfeeding.   General: No apparent distress alert and oriented x3 mood and affect normal, dressed appropriately.  HEENT: Pupils equal, extraocular movements intact  Respiratory: Patient's speak in full sentences and does not appear short of breath  Cardiovascular: No lower extremity edema, non tender, no erythema  Neuro: Cranial nerves II through XII are intact, neurovascularly intact in all extremities with 2+ DTRs and 2+ pulses.  Gait normal with good balance and coordination.  MSK:  Non tender with full range of motion and good stability and symmetric strength and tone of shoulders, elbows, wrist, hip, knee and ankles bilaterally.  Back -patient does have some mild loss of lordosis.  Tenderness over the sacroiliac joints bilaterally today.  Patient does have mild tightness with FABER test.  Patient is noticing more discomfort with extension but no radiation of the pain.  5 out of 5 strength in the lower extremities.  Osteopathic findings   T9 extended rotated and side bent left L4 flexed rotated and side bent left  Sacrum right on right       Assessment and Plan:  SI (sacroiliac) joint dysfunction Patient does have some sacroiliac dysfunction.  Patient continues to have some discomfort and pain.  Nothing that stopping her from activity.  He does have a history of potential gout.  Discussed potential labs with  her.  Would consider possibly checking the uric acid as well as patient does have a history of psoriasis and making sure there is no psoriatic arthritis.  We discussed potential x-rays but patient wanted to hold at this point secondary to new year and financial constraints.  Patient has used the meloxicam only intermittently.  Follow-up with me again in 4  to 6 weeks    Nonallopathic problems  Decision today to treat with OMT was based on Physical Exam  After verbal consent patient was treated with HVLA, ME, FPR techniques in  thoracic, lumbar, and sacral  areas  Patient tolerated the procedure well with improvement in symptoms  Patient given exercises, stretches and lifestyle modifications  See medications in patient instructions if given  Patient will follow up in 4-8 weeks      The above documentation has been reviewed and is accurate and complete [name], DO       Note: This dictation was prepared with Dragon dictation along with smaller phrase technology. Any transcriptional errors that result from this process are unintentional.

## 2020-09-23 ENCOUNTER — Other Ambulatory Visit: Payer: Self-pay

## 2020-09-23 ENCOUNTER — Encounter: Payer: Self-pay | Admitting: Family Medicine

## 2020-09-23 ENCOUNTER — Ambulatory Visit: Payer: 59 | Admitting: Family Medicine

## 2020-09-23 VITALS — BP 132/86 | HR 106 | Ht 64.0 in | Wt 165.0 lb

## 2020-09-23 DIAGNOSIS — M999 Biomechanical lesion, unspecified: Secondary | ICD-10-CM | POA: Diagnosis not present

## 2020-09-23 DIAGNOSIS — M533 Sacrococcygeal disorders, not elsewhere classified: Secondary | ICD-10-CM

## 2020-09-23 NOTE — Patient Instructions (Signed)
Check with insurance about xray Turmeric 500mg  daily watch for upset stomach Consider Iron 65mg  with 500mg  of Vit C around menstruation See me again in 5-6 weeks

## 2020-09-23 NOTE — Assessment & Plan Note (Addendum)
Patient does have some sacroiliac dysfunction.  Patient continues to have some discomfort and pain.  Nothing that stopping her from activity.  He does have a history of potential gout.  Discussed potential labs with her.  Would consider possibly checking the uric acid as well as patient does have a history of psoriasis and making sure there is no psoriatic arthritis.  We discussed potential x-rays but patient wanted to hold at this point secondary to new year and financial constraints.  Patient has used the meloxicam only intermittently.  Follow-up with me again in 4 to 6 weeks

## 2020-10-11 ENCOUNTER — Other Ambulatory Visit: Payer: Self-pay

## 2020-10-11 ENCOUNTER — Encounter: Payer: Self-pay | Admitting: Family Medicine

## 2020-10-11 ENCOUNTER — Ambulatory Visit: Payer: 59 | Admitting: Family Medicine

## 2020-10-11 VITALS — BP 112/70 | HR 81 | Temp 98.1°F | Resp 17 | Ht 64.0 in | Wt 164.6 lb

## 2020-10-11 DIAGNOSIS — L989 Disorder of the skin and subcutaneous tissue, unspecified: Secondary | ICD-10-CM

## 2020-10-11 MED ORDER — CLOTRIMAZOLE-BETAMETHASONE 1-0.05 % EX CREA: 1.0000 "application " | TOPICAL_CREAM | Freq: Two times a day (BID) | CUTANEOUS | 1 refills | Status: DC

## 2020-10-11 NOTE — Progress Notes (Signed)
   Subjective:    Patient ID: Lynn Bell, female    DOB: [birth year], 35 y.o.   MRN: [id number]  HPI Possible ringworm- R hand.  Large red circular area on R palm.  Occasionally itchy.  First noticed in August- will wax and wane.  Will get dry around the edges but doesn't peel.  Hasn't tried OTC products.  Has known dermatitis on her hands but this appears to be different.   Review of Systems For ROS see HPI     Objective:   Physical Exam Vitals reviewed.  Constitutional:      Appearance: Normal appearance.  HENT:     Head: Normocephalic and atraumatic.  Skin:    General: Skin is warm and dry.     Findings: Lesion (faint circular lesion in R palm w/ some crusting around the edge) present.  Neurological:     General: No focal deficit present.     Mental Status: She is alert.  Psychiatric:        Mood and Affect: Mood normal.        Behavior: Behavior normal.        Thought Content: Thought content normal.           Assessment & Plan:  Skin lesion hand- new.  Area is not consistent w/ ringworm but can't officially rule out.  Suspect this is a variation of her contact dermatitis.  But to cover both possibilities, will tx w/ Lotrisone cream BID.  Pt expressed understanding and is in agreement w/ plan.

## 2020-10-11 NOTE — Patient Instructions (Signed)
Follow up as needed or as schedule START the Lotrisone cream twice daily as needed Call with any questions or concerns Stay Safe!  Stay Healthy!

## 2020-10-29 ENCOUNTER — Ambulatory Visit: Payer: 59 | Admitting: Family Medicine

## 2020-10-29 ENCOUNTER — Other Ambulatory Visit: Payer: Self-pay

## 2020-10-29 ENCOUNTER — Encounter: Payer: Self-pay | Admitting: Family Medicine

## 2020-10-29 ENCOUNTER — Ambulatory Visit (INDEPENDENT_AMBULATORY_CARE_PROVIDER_SITE_OTHER): Payer: 59

## 2020-10-29 VITALS — BP 122/74 | HR 86 | Ht 64.0 in | Wt 167.0 lb

## 2020-10-29 DIAGNOSIS — M999 Biomechanical lesion, unspecified: Secondary | ICD-10-CM

## 2020-10-29 DIAGNOSIS — G8929 Other chronic pain: Secondary | ICD-10-CM

## 2020-10-29 DIAGNOSIS — M2550 Pain in unspecified joint: Secondary | ICD-10-CM | POA: Diagnosis not present

## 2020-10-29 DIAGNOSIS — M25552 Pain in left hip: Secondary | ICD-10-CM | POA: Diagnosis not present

## 2020-10-29 DIAGNOSIS — M545 Low back pain, unspecified: Secondary | ICD-10-CM | POA: Diagnosis not present

## 2020-10-29 DIAGNOSIS — M533 Sacrococcygeal disorders, not elsewhere classified: Secondary | ICD-10-CM | POA: Diagnosis not present

## 2020-10-29 DIAGNOSIS — M25551 Pain in right hip: Secondary | ICD-10-CM

## 2020-10-29 LAB — CBC WITH DIFFERENTIAL/PLATELET
Basophils Absolute: 0 10*3/uL (ref 0.0–0.1)
Basophils Relative: 0.6 % (ref 0.0–3.0)
Eosinophils Absolute: 0.2 10*3/uL (ref 0.0–0.7)
Eosinophils Relative: 2.8 % (ref 0.0–5.0)
HCT: 36.3 % (ref 36.0–46.0)
Hemoglobin: 12.3 g/dL (ref 12.0–15.0)
Lymphocytes Relative: 30.1 % (ref 12.0–46.0)
Lymphs Abs: 1.7 10*3/uL (ref 0.7–4.0)
MCHC: 34 g/dL (ref 30.0–36.0)
MCV: 89.4 fl (ref 78.0–100.0)
Monocytes Absolute: 0.6 10*3/uL (ref 0.1–1.0)
Monocytes Relative: 9.9 % (ref 3.0–12.0)
Neutro Abs: 3.2 10*3/uL (ref 1.4–7.7)
Neutrophils Relative %: 56.6 % (ref 43.0–77.0)
Platelets: 272 10*3/uL (ref 150.0–400.0)
RBC: 4.06 Mil/uL (ref 3.87–5.11)
RDW: 13.1 % (ref 11.5–15.5)
WBC: 5.7 10*3/uL (ref 4.0–10.5)

## 2020-10-29 LAB — COMPREHENSIVE METABOLIC PANEL
ALT: 11 U/L (ref 0–35)
AST: 14 U/L (ref 0–37)
Albumin: 4.2 g/dL (ref 3.5–5.2)
Alkaline Phosphatase: 43 U/L (ref 39–117)
BUN: 19 mg/dL (ref 6–23)
CO2: 28 mEq/L (ref 19–32)
Calcium: 9.2 mg/dL (ref 8.4–10.5)
Chloride: 105 mEq/L (ref 96–112)
Creatinine, Ser: 0.8 mg/dL (ref 0.40–1.20)
GFR: 95.42 mL/min (ref >60.00)
Glucose, Bld: 89 mg/dL (ref 70–99)
Potassium: 3.9 mEq/L (ref 3.5–5.1)
Sodium: 140 mEq/L (ref 135–145)
Total Bilirubin: 0.3 mg/dL (ref 0.2–1.2)
Total Protein: 6.8 g/dL (ref 6.0–8.3)

## 2020-10-29 LAB — URIC ACID: Uric Acid, Serum: 5.4 mg/dL (ref 2.4–7.0)

## 2020-10-29 LAB — IBC PANEL
Iron: 150 ug/dL — ABNORMAL HIGH (ref 42–145)
Saturation Ratios: 37.3 % (ref 20.0–50.0)
Transferrin: 287 mg/dL (ref 212.0–360.0)

## 2020-10-29 LAB — TSH: TSH: 1.88 u[IU]/mL (ref 0.35–4.50)

## 2020-10-29 LAB — C-REACTIVE PROTEIN: CRP: <1 mg/dL (ref 0.5–20.0)

## 2020-10-29 LAB — FERRITIN: Ferritin: 24.6 ng/mL (ref 10.0–291.0)

## 2020-10-29 LAB — SEDIMENTATION RATE: Sed Rate: 1 mm/hr (ref 0–20)

## 2020-10-29 NOTE — Patient Instructions (Addendum)
Xray today Labs today Continue everything else Lets see what labs and xray show and we will discuss PT in 4 weeks

## 2020-10-29 NOTE — Progress Notes (Signed)
[place] Phone: [phone] Subjective:   [name], am serving as a scribe for Dr. Hulan Saas. This visit occurred during the SARS-CoV-2 public health emergency.  Safety protocols were in place, including screening questions prior to the visit, additional usage of staff PPE, and extensive cleaning of exam room while observing appropriate contact time as indicated for disinfecting solutions.   I'm seeing this patient by the request  of:  Midge Minium, MD  CC: Low back pain follow-up  [id number]  Lynn Bell is a 35 y.o. female coming in with complaint of back pain. OMT 09/23/2020. Patient states that her pain has not changed since last visit. Pain in lower back pain in SI joints especially on left side into both glutes.  Patient states that it is becoming frustrating.  Feels like she has been doing everything right and continues to have the discomfort and pain.  States it is mostly on the left side.  No radiation of pain just frustrating discomfort at all times.  Patient has been working out doing the exercises taking the vitamins but has not seen significant improvement  Using Vit D and turmeric.        Past Medical History:  Diagnosis Date  . Anxiety   . Cardiac arrhythmia   . Depression    PPD  . History of chicken pox   . Pregnancy induced hypertension   . UTI (lower urinary tract infection)    Past Surgical History:  Procedure Laterality Date  . CESAREAN SECTION N/A 06/01/2017   Procedure: CESAREAN SECTION;  Surgeon: Everlene Farrier, MD;  Location: [facility];  Service: Obstetrics;  Laterality: N/A;  . CESAREAN SECTION N/A 01/07/2020   Procedure: CESAREAN SECTION;  Surgeon: Linda Hedges, DO;  Location: [facility];  Service: Obstetrics;  Laterality: N/A;  REPEAT EDC 01/28/20 NKDA NEED RNFA  . TONSILLECTOMY     Social History   Socioeconomic History  . Marital status:  Married    Spouse name: Not on file  . Number of children: Not on file  . Years of education: Not on file  . Highest education level: Not on file  Occupational History  . Not on file  Tobacco Use  . Smoking status: Former Smoker    Packs/day: 0.25    Types: Cigarettes  . Smokeless tobacco: Never Used  Vaping Use  . Vaping Use: Never used  Substance and Sexual Activity  . Alcohol use: Yes    Comment: weekly  . Drug use: No  . Sexual activity: Not on file  Other Topics Concern  . Not on file  Social History Narrative  . Not on file   Social Determinants of Health   Financial Resource Strain: Not on file  Food Insecurity: Not on file  Transportation Needs: Not on file  Physical Activity: Not on file  Stress: Not on file  Social Connections: Not on file   Allergies  Allergen Reactions  . Latex     Pt "prefers" no latex, but not an allergy Sensitive skin   Family History  Problem Relation Age of Onset  . Breast cancer Maternal Grandmother        possibly paternal also  . Hyperlipidemia Father   . Heart disease Father   . Hypertension Father   . Hyperlipidemia Mother   . Heart disease Mother   . Hypertension Mother   . Pancreatic cancer Mother   . Kidney  disease Paternal Grandfather   . Cancer Maternal Grandfather        kidney removal due to mass       Current Outpatient Medications (Analgesics):  .  ibuprofen (ADVIL) 800 MG tablet, Take 1 tablet (800 mg total) by mouth every 8 (eight) hours as needed. .  meloxicam (MOBIC) 15 MG tablet, TAKE 1 TABLET BY MOUTH EVERY DAY   Current Outpatient Medications (Other):  Marland Kitchen  buPROPion (WELLBUTRIN XL) 300 MG 24 hr tablet, TAKE 1 TABLET BY MOUTH EVERY DAY .  clotrimazole-betamethasone (LOTRISONE) cream, Apply 1 application topically 2 (two) times daily.   Reviewed prior external information including notes and imaging from  primary care provider As well as notes that were available from care everywhere and other  healthcare systems.  Past medical history, social, surgical and family history all reviewed in electronic medical record.  No pertanent information unless stated regarding to the chief complaint.   Review of Systems:  No headache, visual changes, nausea, vomiting, diarrhea, constipation, dizziness, abdominal pain, skin rash, fevers, chills, night sweats, weight loss, swollen lymph nodes, body aches, joint swelling, chest pain, shortness of breath, mood changes. POSITIVE muscle aches  Objective  Blood pressure 122/74, pulse 86, height 5\' 4"  (1.626 m), weight 167 lb (75.8 kg), last menstrual period 10/22/2020, SpO2 99 %, not currently breastfeeding.   General: No apparent distress alert and oriented x3 mood and affect normal, dressed appropriately.  HEENT: Pupils equal, extraocular movements intact  Respiratory: Patient's speak in full sentences and does not appear short of breath  Cardiovascular: No lower extremity edema, non tender, no erythema  Gait normal with good balance and coordination.  MSK:  Non tender with full range of motion and good stability and symmetric strength and tone of shoulders, elbows, wrist, hip, knee and ankles bilaterally.  Low back exam shows some tightness around the left sacroiliac joint.  Tender to palpation in the paraspinal musculature of the left side of the lumbar spine.  Negative straight leg test.  Patient is a pain does seem to be a little more tender than usual exam.  Osteopathic findings T8 extended rotated and side bent right L2 flexed rotated and side bent left Sacrum left on left    Impression and Recommendations:     The above documentation has been reviewed and is accurate and complete [name], DO

## 2020-10-29 NOTE — Assessment & Plan Note (Signed)
Patient's pain still seems to be more corresponding to the left side of the sacroiliac joint. No significant radicular symptoms but continues to have the pain. Patient has had a history of gout previously we will get laboratory work-up to further evaluate if anything else is contributing. We also discussed with patient about the possibility of x-rays and will get those done. Patient continues to have pain will need to consider the possibility of formal physical therapy as well as considering the possibility of advanced imaging. Patient will come back again in 4 weeks

## 2020-10-30 LAB — RHEUMATOID FACTOR: Rheumatoid fact SerPl-aCnc: <14 IU/mL (ref <14)

## 2020-10-30 LAB — ANGIOTENSIN CONVERTING ENZYME: Angiotensin-Converting Enzyme: 29 U/L (ref 9–67)

## 2020-10-30 LAB — PTH, INTACT AND CALCIUM
Calcium: 9.3 mg/dL (ref 8.6–10.2)
PTH: 29 pg/mL (ref 14–64)

## 2020-10-30 LAB — CALCIUM, IONIZED: Calcium, Ion: 5.01 mg/dL (ref 4.8–5.6)

## 2020-10-30 LAB — ANA: Anti Nuclear Antibody (ANA): NEGATIVE

## 2020-10-30 LAB — CYCLIC CITRUL PEPTIDE ANTIBODY, IGG: Cyclic Citrullin Peptide Ab: <16 UNITS

## 2020-11-08 ENCOUNTER — Ambulatory Visit (INDEPENDENT_AMBULATORY_CARE_PROVIDER_SITE_OTHER): Payer: 59 | Admitting: Family Medicine

## 2020-11-08 ENCOUNTER — Encounter: Payer: Self-pay | Admitting: Family Medicine

## 2020-11-08 ENCOUNTER — Other Ambulatory Visit: Payer: Self-pay

## 2020-11-08 VITALS — BP 120/70 | HR 76 | Temp 98.1°F | Resp 17 | Ht 64.0 in | Wt 164.6 lb

## 2020-11-08 DIAGNOSIS — Z0000 Encounter for general adult medical examination without abnormal findings: Secondary | ICD-10-CM | POA: Diagnosis not present

## 2020-11-08 DIAGNOSIS — E663 Overweight: Secondary | ICD-10-CM

## 2020-11-08 MED ORDER — BUPROPION HCL ER (XL) 150 MG PO TB24: 150.0000 mg | ORAL_TABLET | Freq: Every day | ORAL | 3 refills | Status: DC

## 2020-11-08 NOTE — Assessment & Plan Note (Signed)
Pt's PE WNL.  UTD on pap, Tdap.  Check labs.  Anticipatory guidance provided.  

## 2020-11-08 NOTE — Progress Notes (Signed)
   Subjective:    Patient ID: Lynn Bell, female    DOB: Apr 23, 1986, 35 y.o.   MRN: [id number]  HPI CPE- UTD on pap, Tdap.  Reviewed past medical, surgical, family and social histories.   Patient Care Team    Relationship Specialty Notifications Start End  Midge Minium, MD PCP - General Family Medicine  07/06/12   Aloha Gell, MD Consulting Physician Obstetrics and Gynecology  08/30/15     Health Maintenance  Topic Date Due  . COVID-19 Vaccine (1) 11/24/2020 (Originally 05/09/1990)  . INFLUENZA VACCINE  12/12/2020 (Originally 04/14/2020)  . Hepatitis C Screening  05/13/2021 (Originally 25-Feb-1986)  . PAP SMEAR-Modifier  02/19/2023  . TETANUS/TDAP  08/31/2026  . HIV Screening  Completed      Review of Systems Patient reports no vision/ hearing changes, adenopathy,fever, weight change,  persistant/recurrent hoarseness , swallowing issues, chest pain, palpitations, edema, persistant/recurrent cough, hemoptysis, dyspnea (rest/exertional/paroxysmal nocturnal), gastrointestinal bleeding (melena, rectal bleeding), abdominal pain, significant heartburn, bowel changes, GU symptoms (dysuria, hematuria, incontinence), Gyn symptoms (abnormal  bleeding, pain),  syncope, focal weakness, memory loss, numbness & tingling, skin/hair/nail changes, abnormal bruising or bleeding, anxiety, or depression.   This visit occurred during the SARS-CoV-2 public health emergency.  Safety protocols were in place, including screening questions prior to the visit, additional usage of staff PPE, and extensive cleaning of exam room while observing appropriate contact time as indicated for disinfecting solutions.       Objective:   Physical Exam General Appearance:    Alert, cooperative, no distress, appears stated age  Head:    Normocephalic, without obvious abnormality, atraumatic  Eyes:    PERRL, conjunctiva/corneas clear, EOM's intact, fundi    benign, both eyes  Ears:    Normal TM's and external ear  canals, both ears  Nose:   Deferred due to COVID  Throat:   Neck:   Supple, symmetrical, trachea midline, no adenopathy;    Thyroid: no enlargement/tenderness/nodules  Back:     Symmetric, no curvature, ROM normal, no CVA tenderness  Lungs:     Clear to auscultation bilaterally, respirations unlabored  Chest Wall:    No tenderness or deformity   Heart:    Regular rate and rhythm, S1 and S2 normal, no murmur, rub   or gallop  Breast Exam:    Deferred to GYN  Abdomen:     Soft, non-tender, bowel sounds active all four quadrants,    no masses, no organomegaly  Genitalia:    Deferred to GYN  Rectal:    Extremities:   Extremities normal, atraumatic, no cyanosis or edema  Pulses:   2+ and symmetric all extremities  Skin:   Skin color, texture, turgor normal, no rashes or lesions  Lymph nodes:   Cervical, supraclavicular, and axillary nodes normal  Neurologic:   CNII-XII intact, normal strength, sensation and reflexes    throughout          Assessment & Plan:

## 2020-11-08 NOTE — Patient Instructions (Signed)
Follow up in 4-6 weeks to recheck mood We'll notify you of your lab results and make any changes if needed Restart the Wellbutrin daily Use the Alprazolam as needed Call with any questions or concerns Stay Safe!  Stay Healthy!

## 2020-11-09 LAB — CBC WITH DIFFERENTIAL/PLATELET
Absolute Monocytes: 543 cells/uL (ref 200–950)
Basophils Absolute: 31 cells/uL (ref 0–200)
Basophils Relative: 0.5 %
Eosinophils Absolute: 140 cells/uL (ref 15–500)
Eosinophils Relative: 2.3 %
HCT: 38.4 % (ref 35.0–45.0)
Hemoglobin: 13.4 g/dL (ref 11.7–15.5)
Lymphs Abs: 1647 cells/uL (ref 850–3900)
MCH: 30.6 pg (ref 27.0–33.0)
MCHC: 34.9 g/dL (ref 32.0–36.0)
MCV: 87.7 fL (ref 80.0–100.0)
MPV: 10.2 fL (ref 7.5–12.5)
Monocytes Relative: 8.9 %
Neutro Abs: 3739 cells/uL (ref 1500–7800)
Neutrophils Relative %: 61.3 %
Platelets: 276 10*3/uL (ref 140–400)
RBC: 4.38 10*6/uL (ref 3.80–5.10)
RDW: 12.2 % (ref 11.0–15.0)
Total Lymphocyte: 27 %
WBC: 6.1 10*3/uL (ref 3.8–10.8)

## 2020-11-09 LAB — HEPATIC FUNCTION PANEL
AG Ratio: 1.6 (calc) (ref 1.0–2.5)
ALT: 10 U/L (ref 6–29)
AST: 16 U/L (ref 10–30)
Albumin: 4.6 g/dL (ref 3.6–5.1)
Alkaline phosphatase (APISO): 50 U/L (ref 31–125)
Bilirubin, Direct: 0.1 mg/dL (ref 0.0–0.2)
Globulin: 2.9 g/dL (calc) (ref 1.9–3.7)
Indirect Bilirubin: 0.4 mg/dL (calc) (ref 0.2–1.2)
Total Bilirubin: 0.5 mg/dL (ref 0.2–1.2)
Total Protein: 7.5 g/dL (ref 6.1–8.1)

## 2020-11-09 LAB — TSH: TSH: 1.79 mIU/L

## 2020-11-09 LAB — BASIC METABOLIC PANEL
BUN: 19 mg/dL (ref 7–25)
CO2: 20 mmol/L (ref 20–32)
Calcium: 9.5 mg/dL (ref 8.6–10.2)
Chloride: 103 mmol/L (ref 98–110)
Creat: 0.76 mg/dL (ref 0.50–1.10)
Glucose, Bld: 84 mg/dL (ref 65–99)
Potassium: 4 mmol/L (ref 3.5–5.3)
Sodium: 137 mmol/L (ref 135–146)

## 2020-11-09 LAB — LIPID PANEL
Cholesterol: 208 mg/dL — ABNORMAL HIGH (ref <200)
HDL: 104 mg/dL (ref >=50)
LDL Cholesterol (Calc): 87 mg/dL (calc)
Non-HDL Cholesterol (Calc): 104 mg/dL (calc) (ref <130)
Total CHOL/HDL Ratio: 2 (calc) (ref <5.0)
Triglycerides: 76 mg/dL (ref <150)

## 2020-11-12 LAB — TESTOSTERONE, FREE, TOTAL, SHBG
Sex Hormone Binding: 51.7 nmol/L (ref 24.6–122.0)
Testosterone, Free: 0.6 pg/mL (ref 0.0–4.2)
Testosterone: 13 ng/dL (ref 8–60)

## 2020-11-28 ENCOUNTER — Encounter: Payer: Self-pay | Admitting: Family Medicine

## 2020-11-28 ENCOUNTER — Other Ambulatory Visit: Payer: Self-pay

## 2020-11-28 ENCOUNTER — Ambulatory Visit: Payer: 59 | Admitting: Family Medicine

## 2020-11-28 VITALS — BP 130/78 | HR 86 | Ht 64.0 in | Wt 164.0 lb

## 2020-11-28 DIAGNOSIS — G8929 Other chronic pain: Secondary | ICD-10-CM

## 2020-11-28 DIAGNOSIS — M545 Low back pain, unspecified: Secondary | ICD-10-CM | POA: Diagnosis not present

## 2020-11-28 DIAGNOSIS — M4306 Spondylolysis, lumbar region: Secondary | ICD-10-CM | POA: Insufficient documentation

## 2020-11-28 DIAGNOSIS — M999 Biomechanical lesion, unspecified: Secondary | ICD-10-CM

## 2020-11-28 DIAGNOSIS — M542 Cervicalgia: Secondary | ICD-10-CM | POA: Diagnosis not present

## 2020-11-28 NOTE — Assessment & Plan Note (Signed)
Patient does have a pars defect.  Will refer patient to physical therapy.  Do think it would be beneficial.  The meloxicam did help and can refill if necessary.  We did discuss the potential for Effexor and some of the Wellbutrin could potentially help with pain.  First we will try the physical therapy and then will reach out to primary care provider if we would consider possibly making a change.  Discussed core strengthening.  Responding well to manipulation and follow-up with me again 6 weeks

## 2020-11-28 NOTE — Patient Instructions (Signed)
Good to see you PT [address] Avoid excessive extension Ice after activity Read about Effexor would have to change wellbutrin See me again in 6 weeks

## 2020-11-28 NOTE — Progress Notes (Signed)
[facility] [address] [employer] [place] Phone: [phone] Subjective:   I Lynn Bell am serving as a Education administrator for Dr. Hulan Saas.  This visit occurred during the SARS-CoV-2 public health emergency.  Safety protocols were in place, including screening questions prior to the visit, additional usage of staff PPE, and extensive cleaning of exam room while observing appropriate contact time as indicated for disinfecting solutions.   I'm seeing this patient by the request  of:  Midge Minium, MD  CC: back pain follow up   PNT:[id number]   10/29/2020 Patient's pain still seems to be more corresponding to the left side of the sacroiliac joint. No significant radicular symptoms but continues to have the pain. Patient has had a history of gout previously we will get laboratory work-up to further evaluate if anything else is contributing. We also discussed with patient about the possibility of x-rays and will get those done. Patient continues to have pain will need to consider the possibility of formal physical therapy as well as considering the possibility of advanced imaging. Patient will come back again in 4 weeks  11/28/2020 Lynn Bell is a 35 y.o. female coming in with complaint of back and neck pain Patient states she feels ok but she has had pain and pressure consistently. Did the meloxicam for 5 days straight.   Medications patient has been prescribed:   Taking:  Lumbar spine xray 10/29/2020 IMPRESSION: 1. No acute displaced fracture or malalignment. 2. Probable pars defect at the L5 level without evidence for significant anterolisthesis.    All labs are normal   Lumbar x-rays were taken in February 2022 visualized by me showing pars defect at the L5 level Reviewed prior external information including notes and imaging from previsou exam, outside providers and external EMR if available.   As well as notes that were available from  care everywhere and other healthcare systems.  Past medical history, social, surgical and family history all reviewed in electronic medical record.  No pertanent information unless stated regarding to the chief complaint.   Past Medical History:  Diagnosis Date  . Anxiety   . Cardiac arrhythmia   . Depression    PPD  . History of chicken pox   . Pregnancy induced hypertension   . UTI (lower urinary tract infection)     Allergies  Allergen Reactions  . Latex     Pt "prefers" no latex, but not an allergy Sensitive skin     Review of Systems:  No headache, visual changes, nausea, vomiting, diarrhea, constipation, dizziness, abdominal pain, skin rash, fevers, chills, night sweats, weight loss, swollen lymph nodes, body aches, joint swelling, chest pain, shortness of breath, mood changes. POSITIVE muscle aches  Objective  Blood pressure 130/78, pulse 86, height 5\' 4"  (1.626 m), weight 164 lb (74.4 kg), SpO2 100 %, not currently breastfeeding.   General: No apparent distress alert and oriented x3 mood and affect normal, dressed appropriately.  HEENT: Pupils equal, extraocular movements intact  Respiratory: Patient's speak in full sentences and does not appear short of breath  Cardiovascular: No lower extremity edema, non tender, no erythema  Gait normal with good balance and coordination.  MSK:  Non tender with full range of motion and good stability and symmetric strength and tone of shoulders, elbows, wrist, hip, knee and ankles bilaterally.  Back -low back exam does have some mild loss of lordosis.  Patient does have some mild increase in pain with extension of  the back.  Patient has mild tightness of FABER test right greater than left.  Negative straight leg test.  Osteopathic findings  T8 extended rotated and side bent left L2 flexed rotated and side bent right Sacrum right on right       Assessment and Plan:   Pars defect of lumbar spine Patient does have a pars  defect.  Will refer patient to physical therapy.  Do think it would be beneficial.  The meloxicam did help and can refill if necessary.  We did discuss the potential for Effexor and some of the Wellbutrin could potentially help with pain.  First we will try the physical therapy and then will reach out to primary care provider if we would consider possibly making a change.  Discussed core strengthening.  Responding well to manipulation and follow-up with me again 6 weeks    Nonallopathic problems  Decision today to treat with OMT was based on Physical Exam  After verbal consent patient was treated with HVLA, ME, FPR techniques in  thoracic, lumbar, and sacral  areas  Patient tolerated the procedure well with improvement in symptoms  Patient given exercises, stretches and lifestyle modifications  See medications in patient instructions if given  Patient will follow up in 4-8 weeks      The above documentation has been reviewed and is accurate and complete Lynn Pulley, DO       Note: This dictation was prepared with Dragon dictation along with smaller phrase technology. Any transcriptional errors that result from this process are unintentional.

## 2020-12-13 ENCOUNTER — Telehealth (INDEPENDENT_AMBULATORY_CARE_PROVIDER_SITE_OTHER): Payer: 59 | Admitting: Family Medicine

## 2020-12-13 ENCOUNTER — Encounter: Payer: Self-pay | Admitting: Family Medicine

## 2020-12-13 DIAGNOSIS — F419 Anxiety disorder, unspecified: Secondary | ICD-10-CM | POA: Diagnosis not present

## 2020-12-13 NOTE — Progress Notes (Signed)
I connected with  Lynn Bell on 12/13/20 by a video enabled telemedicine application and verified that I am speaking with the correct person using two identifiers.   I discussed the limitations of evaluation and management by telemedicine. The patient expressed understanding and agreed to proceed.
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
## 2020-12-19 ENCOUNTER — Telehealth: Payer: Self-pay | Admitting: Family Medicine

## 2020-12-19 NOTE — Telephone Encounter (Signed)
Pt called asking for a refill on the Xanax to be [facility] to [facility] [address] in [place]

## 2020-12-19 NOTE — Telephone Encounter (Signed)
Patient asking for refill on xanax she hasn't been on that mediation since 12/25/2018. Please Asvise

## 2020-12-23 ENCOUNTER — Telehealth: Payer: Self-pay | Admitting: Family Medicine

## 2020-12-23 MED ORDER — ALPRAZOLAM 0.25 MG PO TABS: 0.2500 mg | ORAL_TABLET | Freq: Two times a day (BID) | ORAL | 1 refills | Status: DC | PRN

## 2020-12-23 NOTE — Telephone Encounter (Signed)
..  Medication Refills  Last OV:  Medication:  Alprazolam   [facility] [place], [place] [address]  Let patient know to contact pharmacy at the end of the day to make sure medication is ready.   Please notify patient to allow 48-72 hours to process.  Encourage patient to contact the pharmacy for refills or they can request refills through [place] out below:   Last refill:  QTY:  Refill Date:    Other Comments:  [facility] [place], [place] for [facility]ill?  Please advise.

## 2020-12-23 NOTE — Telephone Encounter (Signed)
Prescription filled at pt's request ?

## 2020-12-23 NOTE — Telephone Encounter (Signed)
Requesting:Xanax 0.25mg  Contract: UDS: Last Visit4/1/22 v/v: Next Visit:n/a Last Refill:12/30/18  Please Advise

## 2021-01-08 NOTE — Progress Notes (Signed)
[facility] [place] Phone: [phone] Subjective:   Lynn Bell, am serving as a scribe for Dr. Hulan Saas. This visit occurred during the SARS-CoV-2 public health emergency.  Safety protocols were in place, including screening questions prior to the visit, additional usage of staff PPE, and extensive cleaning of exam room while observing appropriate contact time as indicated for disinfecting solutions.   I'm seeing this patient by the request  of:  Midge Minium, MD  CC: Back pain follow-up  HEN:[id number]  Lynn Bell is a 35 y.o. female coming in with complaint of back and neck pain. OMT 11/28/2020. Patient states that her back pain is worse this week. Patient has mother living in home due to Hospice. Does take Meloxicam as needed with flares.  Patient has had some discomfort from time to time more recently with patient being the primary caregiver for her mother who is now on hospice at her house, was unable to start physical therapy.  Medications patient has been prescribed: Meloxicam  Taking: Intermittently.      Past medical history, social, surgical and family history all reviewed in electronic medical record.  Bell pertanent information unless stated regarding to the chief complaint.   Past Medical History:  Diagnosis Date  . Anxiety   . Cardiac arrhythmia   . Depression    PPD  . History of chicken pox   . Pregnancy induced hypertension   . UTI (lower urinary tract infection)     Allergies  Allergen Reactions  . Latex     Pt "prefers" Bell latex, but not an allergy Sensitive skin     Review of Systems:  Bell headache, visual changes, nausea, vomiting, diarrhea, constipation, dizziness, abdominal pain, skin rash, fevers, chills, night sweats, weight loss, swollen lymph nodes, body aches, joint swelling, chest pain, shortness of breath, mood changes. POSITIVE muscle aches, increase in  stress  Objective  Blood pressure 128/90, pulse 91, height 5\' 4"  (1.626 m), weight 159 lb (72.1 kg), SpO2 99 %, not currently breastfeeding.   General: Bell apparent distress alert and oriented x3 mood and affect normal, dressed appropriately.  HEENT: Pupils equal, extraocular movements intact  Respiratory: Patient's speak in full sentences and does not appear short of breath  Cardiovascular: Bell lower extremity edema, non tender, Bell erythema  Gait normal with good balance and coordination.  MSK:  Non tender with full range of motion and good stability and symmetric strength and tone of shoulders, elbows, wrist, hip, knee and ankles bilaterally.  Back -more tightness noted in the paraspinal musculature of the lumbar spine.  Patient does have some tenderness to palpation mostly on the right greater than left.  Worsening pain with extension of the back.  Osteopathic findings   T9 extended rotated and side bent left L2 flexed rotated and side bent right Sacrum right on right       Assessment and Plan:  Pars defect of lumbar spine Focused on patient's lower back today.  Mild chronic exacerbation for this.  Patient unfortunately has had a lot of stress and has not had have time for herself.  We discussed the meloxicam.  Discussed using it in 3 to 5-day burst.  Discussed continuing to stay active where she can.  See patient in more frequent follow-ups in 4 to 6 weeks worsening pain patient is to call us.  Patient is also to call us if she continues to have any more difficulty with  anxiety.    Nonallopathic problems  Decision today to treat with OMT was based on Physical Exam  After verbal consent patient was treated with HVLA, ME, FPR techniques in  thoracic, lumbar, and sacral  areas  Patient tolerated the procedure well with improvement in symptoms  Patient given exercises, stretches and lifestyle modifications  See medications in patient instructions if given  Patient will follow  up in 4-8 weeks      The above documentation has been reviewed and is accurate and complete [name], DO       Note: This dictation was prepared with Dragon dictation along with smaller phrase technology. Any transcriptional errors that result from this process are unintentional.

## 2021-01-09 ENCOUNTER — Other Ambulatory Visit: Payer: Self-pay

## 2021-01-09 ENCOUNTER — Encounter: Payer: Self-pay | Admitting: Family Medicine

## 2021-01-09 ENCOUNTER — Ambulatory Visit: Payer: 59 | Admitting: Family Medicine

## 2021-01-09 VITALS — BP 128/90 | HR 91 | Ht 64.0 in | Wt 159.0 lb

## 2021-01-09 DIAGNOSIS — M9904 Segmental and somatic dysfunction of sacral region: Secondary | ICD-10-CM

## 2021-01-09 DIAGNOSIS — M9902 Segmental and somatic dysfunction of thoracic region: Secondary | ICD-10-CM | POA: Diagnosis not present

## 2021-01-09 DIAGNOSIS — M9903 Segmental and somatic dysfunction of lumbar region: Secondary | ICD-10-CM

## 2021-01-09 DIAGNOSIS — M4306 Spondylolysis, lumbar region: Secondary | ICD-10-CM | POA: Diagnosis not present

## 2021-01-09 NOTE — Patient Instructions (Signed)
Good to see you Try Meloxicam around menstruation See me again in 5 weeks

## 2021-01-09 NOTE — Assessment & Plan Note (Signed)
Focused on patient's lower back today.  Mild chronic exacerbation for this.  Patient unfortunately has had a lot of stress and has not had have time for herself.  We discussed the meloxicam.  Discussed using it in 3 to 5-day burst.  Discussed continuing to stay active where she can.  See patient in more frequent follow-ups in 4 to 6 weeks worsening pain patient is to call us.  Patient is also to call us if she continues to have any more difficulty with anxiety.

## 2021-02-13 ENCOUNTER — Ambulatory Visit: Payer: 59 | Admitting: Family Medicine

## 2021-03-06 NOTE — Progress Notes (Signed)
[place] Phone: [phone] Subjective:   Lynn [name], am serving as a scribe for Dr. Hulan [name].  This visit occurred during the SARS-CoV-2 public health emergency.  Safety protocols were in place, including screening questions prior to the visit, additional usage of staff PPE, and extensive cleaning of exam room while observing appropriate contact time as indicated for disinfecting solutions.    I'm seeing this patient by the request  of:  Lynn Minium, MD  CC: Back pain follow-up  HCW:[id number]  Lynn [name] is a 35 y.o. female coming in with complaint of back and neck pain.  Patient does have a known pars defect of the lumbar spine.  OMT 01/09/2021. Patient states that she does have more pain around menstruation but is otherwise the same as last visit.  Recently did lose her mother.  Has not been doing the exercises.  A lot of stress.  Medications patient has been prescribed: Meloxicam  Taking: Yes        Past Medical History:  Diagnosis Date   Anxiety    Cardiac arrhythmia    Depression    PPD   History of chicken pox    Pregnancy induced hypertension    UTI (lower urinary tract infection)     Allergies  Allergen Reactions   Latex     Pt "prefers" [name] latex, but not an allergy Sensitive skin     Review of Systems:  [name] headache, visual changes, nausea, vomiting, diarrhea, constipation, dizziness, abdominal pain, skin rash, fevers, chills, night sweats, weight loss, swollen lymph nodes,  joint swelling, chest pain, shortness of breath, mood changes. POSITIVE muscle aches, body aches  Objective  Blood pressure 120/88, pulse 89, height 5\' 4"  (1.626 m), weight 152 lb (68.9 kg), SpO2 99 %, not currently breastfeeding.   General: [name] apparent distress alert and oriented x3 mood and affect normal, dressed appropriately.  Tearful HEENT: Pupils equal, extraocular movements intact   Respiratory: Patient's speak in full sentences and does not appear short of breath  Cardiovascular: [name] lower extremity edema, non tender, [name] erythema  Low back exam continued tightness in the paraspinal musculature of the lumbar spine.  Patient now has 5 out of 5 strength.  Still some increased discomfort with extension of the back.  [name] true radicular symptoms.  Deep tendon reflexes are intact  Osteopathic findings   T4 extended rotated and side bent left L2 flexed rotated and side bent right Sacrum right on right       Assessment and Plan:  Pars defect of lumbar spine Chronic problem with mild exacerbation with patient not responding well only because patient has not been able to take care of herself recently.  We discussed with her having the loss of a family member that if any counseling is necessary we are able to help.  Patient states that she is already been in contact with a counselor and is awaiting for scheduling.  Patient does have the meloxicam for breakthrough pain.  We will hold on any other medications at the moment.  Follow-up with me again 5 to 6 weeks   Nonallopathic problems  Decision today to treat with OMT was based on Physical Exam  After verbal consent patient was treated with HVLA, ME, FPR techniques in  thoracic, lumbar, and sacral  areas  Patient tolerated the procedure well with improvement in symptoms  Patient given exercises, stretches and lifestyle modifications  See medications  in patient instructions if given  Patient will follow up in 5-6 weeks      The above documentation has been reviewed and is accurate and complete Lynn Pulley, DO        Note: This dictation was prepared with Dragon dictation along with smaller phrase technology. Any transcriptional errors that result from this process are unintentional.

## 2021-03-07 ENCOUNTER — Encounter: Payer: Self-pay | Admitting: Family Medicine

## 2021-03-07 ENCOUNTER — Ambulatory Visit: Payer: 59 | Admitting: Family Medicine

## 2021-03-07 ENCOUNTER — Other Ambulatory Visit: Payer: Self-pay

## 2021-03-07 VITALS — BP 120/88 | HR 89 | Ht 64.0 in | Wt 152.0 lb

## 2021-03-07 DIAGNOSIS — M9904 Segmental and somatic dysfunction of sacral region: Secondary | ICD-10-CM | POA: Diagnosis not present

## 2021-03-07 DIAGNOSIS — M9903 Segmental and somatic dysfunction of lumbar region: Secondary | ICD-10-CM | POA: Diagnosis not present

## 2021-03-07 DIAGNOSIS — M4306 Spondylolysis, lumbar region: Secondary | ICD-10-CM

## 2021-03-07 DIAGNOSIS — M9902 Segmental and somatic dysfunction of thoracic region: Secondary | ICD-10-CM | POA: Diagnosis not present

## 2021-03-07 NOTE — Patient Instructions (Signed)
Good to see you Sorry for your loss Try to do exercises when you can See me in 5-6 weeks

## 2021-03-07 NOTE — Assessment & Plan Note (Signed)
Chronic problem with mild exacerbation with patient not responding well only because patient has not been able to take care of herself recently.  We discussed with her having the loss of a family member that if any counseling is necessary we are able to help.  Patient states that she is already been in contact with a counselor and is awaiting for scheduling.  Patient does have the meloxicam for breakthrough pain.  We will hold on any other medications at the moment.  Follow-up with me again 5 to 6 weeks

## 2021-03-11 ENCOUNTER — Telehealth: Payer: Self-pay | Admitting: Family Medicine

## 2021-03-11 NOTE — Telephone Encounter (Signed)
Patient called and said that one of her other doctors suggested going to a psychiatrist for medication management.  Patient feels like she wants to stay with you, but would like your opinion on this.  Please advise.

## 2021-03-12 ENCOUNTER — Encounter: Payer: Self-pay | Admitting: *Deleted

## 2021-03-12 NOTE — Telephone Encounter (Signed)
I am not at all opposed to seeing a psychiatrist if she feels that her anxiety/depression/mood medications are not as they should be.  We can either make adjustments here or she can see a psychiatrist.  It really is whatever she's most comfortable with

## 2021-03-12 NOTE — Telephone Encounter (Signed)
Called patient with pcp recommendations. Patient states she will call back later to schedule an appt to go over possible med changes.

## 2021-03-13 ENCOUNTER — Encounter: Payer: Self-pay | Admitting: Family Medicine

## 2021-03-13 ENCOUNTER — Ambulatory Visit: Payer: 59 | Admitting: Family Medicine

## 2021-03-13 ENCOUNTER — Other Ambulatory Visit: Payer: Self-pay

## 2021-03-13 VITALS — BP 130/88 | HR 73 | Temp 98.4°F | Resp 18 | Ht 64.0 in | Wt 152.4 lb

## 2021-03-13 DIAGNOSIS — H60543 Acute eczematoid otitis externa, bilateral: Secondary | ICD-10-CM | POA: Diagnosis not present

## 2021-03-13 DIAGNOSIS — F419 Anxiety disorder, unspecified: Secondary | ICD-10-CM

## 2021-03-13 MED ORDER — FLUOCINOLONE ACETONIDE 0.01 % OT OIL: 5.0000 [drp] | TOPICAL_OIL | Freq: Two times a day (BID) | OTIC | 0 refills | Status: DC

## 2021-03-13 MED ORDER — FLUOXETINE HCL 10 MG PO CAPS: 10.0000 mg | ORAL_CAPSULE | Freq: Every day | ORAL | 3 refills | Status: DC

## 2021-03-13 NOTE — Progress Notes (Signed)
   Subjective:    Patient ID: Lynn Bell, female    DOB: 1986-07-28, 35 y.o.   MRN: [id number]  HPI Anxiety- ongoing issue for pt.  Currently on Wellbutrin 150mg  daily an Buspar 10mg  BID.  Has Alprazolam to use as needed.  Mom recently passed after a battle with pancreatic cancer.  She was responsible for planning the entire service.  Mom had been living w/ her for 5 weeks prior to passing.  'i feel loss'.  She is now trying to deal w/ dad who may or may not have dementia.  Having a hard time w/ sleep.  Pt has racing heart, chest pressure.  Increased irritability.  Starts counseling w/ Hospice on Tuesday.  Ear eczema- 'they'Bell driving me insane'.  Ears are flaking, peeling, oozing and very itchy   Review of Systems For ROS see HPI   This visit occurred during the SARS-CoV-2 public health emergency.  Safety protocols were in place, including screening questions prior to the visit, additional usage of staff PPE, and extensive cleaning of exam room while observing appropriate contact time as indicated for disinfecting solutions.      Objective:   Physical Exam Vitals reviewed.  Constitutional:      General: She is not in acute distress.    Appearance: Normal appearance. She is not ill-appearing.  HENT:     Head: Normocephalic and atraumatic.     Ears:     Comments: Redness, flaking of external ear canals bilaterally Skin:    General: Skin is warm and dry.  Neurological:     General: No focal deficit present.     Mental Status: She is alert and oriented to person, place, and time.  Psychiatric:        Mood and Affect: Mood normal.        Behavior: Behavior normal.        Thought Content: Thought content normal.        Judgment: Judgment normal.          Assessment & Plan:  Ear Eczema- new.  Pt has Dermatology appt pending.  Will start Dermotic in the interim.  Pt expressed understanding and is in agreement w/ plan.

## 2021-03-13 NOTE — Assessment & Plan Note (Signed)
Deteriorated.  Pt is having a very hard time since mom passed away 6 weeks ago.  She was mom's caregiver and now feels lost.  She was handling all of the logistics of her passing and was very task oriented.  Now that most of the tasks are complete, she is overwhelmed emotionally.  Starts counseling w/ Hospice next week.  Will restart Prozac.  Continue Wellbutrin and Buspar.  Will follow closely.

## 2021-03-13 NOTE — Patient Instructions (Signed)
Follow up in 3-4 weeks to recheck mood ADD the Fluoxetine (Prozac) once daily Continue the Wellbutrin and Buspar Use the Dermotic as directed for the ears Call with any questions or concerns Hang in there!!!

## 2021-04-03 ENCOUNTER — Ambulatory Visit: Payer: 59 | Admitting: Family Medicine

## 2021-04-16 ENCOUNTER — Encounter: Payer: Self-pay | Admitting: Family Medicine

## 2021-04-16 ENCOUNTER — Other Ambulatory Visit: Payer: Self-pay

## 2021-04-16 ENCOUNTER — Ambulatory Visit: Payer: 59 | Admitting: Family Medicine

## 2021-04-16 VITALS — BP 120/84 | HR 82 | Ht 64.0 in | Wt 152.0 lb

## 2021-04-16 DIAGNOSIS — M999 Biomechanical lesion, unspecified: Secondary | ICD-10-CM | POA: Diagnosis not present

## 2021-04-16 DIAGNOSIS — M4306 Spondylolysis, lumbar region: Secondary | ICD-10-CM

## 2021-04-16 DIAGNOSIS — M9904 Segmental and somatic dysfunction of sacral region: Secondary | ICD-10-CM

## 2021-04-16 NOTE — Progress Notes (Signed)
[place] Phone: [phone] Subjective:    I'm seeing this patient by the request  of:  Midge Minium, MD  CC: back and neck pain   [id number]  Lynn Bell is a 35 y.o. female coming in with complaint of back and neck pain. OMT 03/07/2021. Patient states lower back feel like there is a lot of pressure and is worse when laying down.  Patient states that the pain seems to be worsening.  Sometimes stops her from activity at this point.  Sometimes does have significant increased tightness.  Does have a history of psoriasis patient continues to not feel like herself is unable to increase activity secondary to the discomfort  Medications patient has been prescribed:   Taking:          Past Medical History:  Diagnosis Date   Anxiety    Cardiac arrhythmia    Depression    PPD   History of chicken pox    Pregnancy induced hypertension    UTI (lower urinary tract infection)     Allergies  Allergen Reactions   Latex     Pt "prefers" no latex, but not an allergy Sensitive skin     Review of Systems:  No headache, visual changes, nausea, vomiting, diarrhea, constipation, dizziness, abdominal pain, skin rash, fevers, chills, night sweats, weight loss, swollen lymph nodes, body aches, joint swelling, chest pain, shortness of breath, mood changes. POSITIVE muscle aches  Objective  Blood pressure 120/84, pulse 82, height '5\' 4"'$  (1.626 m), weight 152 lb (68.9 kg), SpO2 97 %, not currently breastfeeding.   General: No apparent distress alert and oriented x3 mood and affect normal, dressed appropriately.  HEENT: Pupils equal, extraocular movements intact  Respiratory: Patient's speak in full sentences and does not appear short of breath  Cardiovascular: No lower extremity edema, non tender, no erythema  Low back exam shows the patient does have tenderness to palpation diffusely of the lumbar spine.   Worsening pain with some extension of the back.  No significant radicular symptoms at the moment.  Osteopathic findings   T11 extended rotated and side bent left L2 flexed rotated and side bent right L5 flexed rotated and side bent left Sacrum right on right     Assessment and Plan:  Pars defect of lumbar spine Chronic problem with exacerbation.  Worsening pain at the moment.  Attempted some mild muscle energy but held HVLA secondary to the amount of discomfort patient is having.  Patient has been doing the conservative therapy as well as medications but continues to have breakthrough.  I do feel at this point secondary to the pars defect and the possibility of instability I do feel MRI is necessary.  Because there is some pelvic pain and even though ultrasound was unremarkable I do think it is worth this potentially doing an MRI of the pelvic area as well.  We will see what the results are unchanged medical management as appropriate.  Follow-up with me again after imaging to discuss further.  If no significant findings we will continue with osteopathic manipulation.   Nonallopathic problems  Decision today to treat with OMT was based on Physical Exam  After verbal consent patient was treated with  ME, FPR techniques in thoracic, lumbar, and sacral  areas avoided HVLA today secondary to the amount of the severity of pain and awaiting imaging.   Patient will follow up in 4-8 weeks  The above documentation has been reviewed and is accurate and complete [name], DO        Note: This dictation was prepared with Dragon dictation along with smaller phrase technology. Any transcriptional errors that result from this process are unintentional.

## 2021-04-16 NOTE — Patient Instructions (Addendum)
Good to see you  MRI lumbar and pelvis ordered to [facility] imaging [id number] Will write once we get results of MRI's Continue to stay active See me again in 6 weeks

## 2021-04-16 NOTE — Assessment & Plan Note (Signed)
Chronic problem with exacerbation.  Worsening pain at the moment.  Attempted some mild muscle energy but held HVLA secondary to the amount of discomfort patient is having.  Patient has been doing the conservative therapy as well as medications but continues to have breakthrough.  I do feel at this point secondary to the pars defect and the possibility of instability I do feel MRI is necessary.  Because there is some pelvic pain and even though ultrasound was unremarkable I do think it is worth this potentially doing an MRI of the pelvic area as well.  We will see what the results are unchanged medical management as appropriate.  Follow-up with me again after imaging to discuss further.  If no significant findings we will continue with osteopathic manipulation.

## 2021-05-08 ENCOUNTER — Other Ambulatory Visit: Payer: Self-pay

## 2021-05-08 ENCOUNTER — Ambulatory Visit
Admission: RE | Admit: 2021-05-08 | Discharge: 2021-05-08 | Disposition: A | Discharge status: Home / Self Care | Payer: 59 | Source: Ambulatory Visit | Attending: Family Medicine | Admitting: Family Medicine

## 2021-05-08 DIAGNOSIS — M999 Biomechanical lesion, unspecified: Secondary | ICD-10-CM

## 2021-05-12 ENCOUNTER — Telehealth: Payer: Self-pay | Admitting: Family Medicine

## 2021-05-12 NOTE — Telephone Encounter (Signed)
Patient called back in response to her MRI results. She had a couple questions regarding the epidural (process, how long it would last, etc.).  Please advise.

## 2021-05-13 NOTE — Telephone Encounter (Signed)
Left message for patient to call back  

## 2021-05-27 ENCOUNTER — Other Ambulatory Visit: Payer: Self-pay

## 2021-05-27 ENCOUNTER — Telehealth: Payer: Self-pay | Admitting: Family Medicine

## 2021-05-27 ENCOUNTER — Ambulatory Visit: Payer: 59 | Admitting: Family Medicine

## 2021-05-27 DIAGNOSIS — M5416 Radiculopathy, lumbar region: Secondary | ICD-10-CM

## 2021-05-27 NOTE — Telephone Encounter (Signed)
Patient called asking if the order for the epidural could be placed? I think she was under the impression that it already was but [facility] did not have anything on file for her.  I advised her to reach out to [facility] tomorrow to schedule if she would like.

## 2021-05-27 NOTE — Telephone Encounter (Signed)
Order placed

## 2021-05-28 ENCOUNTER — Ambulatory Visit: Payer: 59 | Admitting: Family Medicine

## 2021-06-11 ENCOUNTER — Other Ambulatory Visit: Payer: Self-pay | Admitting: Family Medicine

## 2021-06-19 ENCOUNTER — Ambulatory Visit
Admission: RE | Admit: 2021-06-19 | Discharge: 2021-06-19 | Disposition: A | Discharge status: Home / Self Care | Payer: 59 | Source: Ambulatory Visit | Attending: Family Medicine | Admitting: Family Medicine

## 2021-06-19 ENCOUNTER — Other Ambulatory Visit: Payer: Self-pay

## 2021-06-19 DIAGNOSIS — M5416 Radiculopathy, lumbar region: Secondary | ICD-10-CM

## 2021-06-19 MED ORDER — METHYLPREDNISOLONE ACETATE 40 MG/ML INJ SUSP (RADIOLOG
80.0000 mg | Freq: Once | INTRAMUSCULAR | Status: AC
  Administered 2021-06-19: 80 mg via EPIDURAL

## 2021-06-19 MED ORDER — IOPAMIDOL (ISOVUE-M 200) INJECTION 41%
1.0000 mL | Freq: Once | INTRAMUSCULAR | Status: AC
  Administered 2021-06-19: 1 mL via EPIDURAL

## 2021-06-19 NOTE — Discharge Instructions (Signed)

## 2021-06-20 ENCOUNTER — Ambulatory Visit: Payer: 59 | Admitting: Family Medicine

## 2021-06-20 ENCOUNTER — Encounter: Payer: Self-pay | Admitting: Family Medicine

## 2021-06-20 VITALS — BP 110/80 | HR 94 | Temp 97.9°F | Resp 16 | Wt 156.4 lb

## 2021-06-20 DIAGNOSIS — F419 Anxiety disorder, unspecified: Secondary | ICD-10-CM

## 2021-06-20 DIAGNOSIS — H60549 Acute eczematoid otitis externa, unspecified ear: Secondary | ICD-10-CM | POA: Diagnosis not present

## 2021-06-20 DIAGNOSIS — H6122 Impacted cerumen, left ear: Secondary | ICD-10-CM

## 2021-06-20 DIAGNOSIS — R5383 Other fatigue: Secondary | ICD-10-CM

## 2021-06-20 NOTE — Progress Notes (Addendum)
   Subjective:    Patient ID: Lynn Bell, female    DOB: Jan 23, 1986, 35 y.o.   MRN: [id number]  HPI Anxiety- restarted Prozac at last visit.  Feels that this has been an improvement.  Continues grief counseling at [facility] and this is going well.  Plans to switch to [facility] for all encompassing care.  Also on Wellbutrin 150mg  daily and Buspar 10mg  daily  Fatigue- ongoing issue for pt.  Still some difficulty sleeping.  + exhaustion.    Ear issue- pt used DermOtic drops w/ some improvement.  Continues to shed skin.  Now L ear is muffled.   Review of Systems For ROS see HPI    This visit occurred during the SARS-CoV-2 public health emergency.  Safety protocols were in place, including screening questions prior to the visit, additional usage of staff PPE, and extensive cleaning of exam room while observing appropriate contact time as indicated for disinfecting solutions.      Objective:   Physical Exam Vitals reviewed.  Constitutional:      General: She is not in acute distress.    Appearance: Normal appearance. She is not ill-appearing.  HENT:     Head: Normocephalic and atraumatic.     Right Ear: Tympanic membrane normal.     Left Ear: There is impacted cerumen (successfully removed after pt consent to irrigate).     Ears:     Comments: Dry, flaking ear canals bilaterally Eyes:     Extraocular Movements: Extraocular movements intact.     Conjunctiva/sclera: Conjunctivae normal.     Pupils: Pupils are equal, round, and reactive to light.  Cardiovascular:     Rate and Rhythm: Normal rate and regular rhythm.     Pulses: Normal pulses.     Heart sounds: Normal heart sounds.  Pulmonary:     Effort: Pulmonary effort is normal. No respiratory distress.     Breath sounds: Normal breath sounds. No wheezing, rhonchi or rales.  Skin:    General: Skin is warm and dry.  Neurological:     General: No focal deficit present.     Mental Status: She is alert and  oriented to person, place, and time.  Psychiatric:        Mood and Affect: Mood normal.        Behavior: Behavior normal.        Thought Content: Thought content normal.          Assessment & Plan:  Impacted cerumen- new.  L side.  Explains why L ear is muffled.  Successful irrigation w/ immediate symptom improvement.  Pt tolerated procedure w/o difficulty.  Fatigue- new.  Suspect this is due to her ongoing grief, anxiety, and high stress situation.  Will check labs to r/o metabolic cause.

## 2021-06-20 NOTE — Patient Instructions (Addendum)
Follow up as needed or as scheduled We'll notify you of your lab results and make any changes if needed Continue the ear drops Call with any questions or concerns Hang in there!!!

## 2021-06-21 LAB — CBC WITH DIFFERENTIAL/PLATELET
Absolute Monocytes: 874 cells/uL (ref 200–950)
Basophils Absolute: 37 cells/uL (ref 0–200)
Basophils Relative: 0.4 %
Eosinophils Absolute: 84 cells/uL (ref 15–500)
Eosinophils Relative: 0.9 %
HCT: 38.9 % (ref 35.0–45.0)
Hemoglobin: 12.9 g/dL (ref 11.7–15.5)
Lymphs Abs: 1832 cells/uL (ref 850–3900)
MCH: 30.9 pg (ref 27.0–33.0)
MCHC: 33.2 g/dL (ref 32.0–36.0)
MCV: 93.1 fL (ref 80.0–100.0)
MPV: 9.4 fL (ref 7.5–12.5)
Monocytes Relative: 9.4 %
Neutro Abs: 6473 cells/uL (ref 1500–7800)
Neutrophils Relative %: 69.6 %
Platelets: 300 10*3/uL (ref 140–400)
RBC: 4.18 10*6/uL (ref 3.80–5.10)
RDW: 12 % (ref 11.0–15.0)
Total Lymphocyte: 19.7 %
WBC: 9.3 10*3/uL (ref 3.8–10.8)

## 2021-06-21 LAB — HEPATIC FUNCTION PANEL
AG Ratio: 2 (calc) (ref 1.0–2.5)
ALT: 15 U/L (ref 6–29)
AST: 16 U/L (ref 10–30)
Albumin: 4.6 g/dL (ref 3.6–5.1)
Alkaline phosphatase (APISO): 53 U/L (ref 31–125)
Bilirubin, Direct: 0.1 mg/dL (ref 0.0–0.2)
Globulin: 2.3 g/dL (calc) (ref 1.9–3.7)
Indirect Bilirubin: 0.2 mg/dL (calc) (ref 0.2–1.2)
Total Bilirubin: 0.3 mg/dL (ref 0.2–1.2)
Total Protein: 6.9 g/dL (ref 6.1–8.1)

## 2021-06-21 LAB — BASIC METABOLIC PANEL
BUN: 18 mg/dL (ref 7–25)
CO2: 30 mmol/L (ref 20–32)
Calcium: 9.6 mg/dL (ref 8.6–10.2)
Chloride: 102 mmol/L (ref 98–110)
Creat: 0.87 mg/dL (ref 0.50–0.97)
Glucose, Bld: 88 mg/dL (ref 65–99)
Potassium: 4.8 mmol/L (ref 3.5–5.3)
Sodium: 138 mmol/L (ref 135–146)

## 2021-06-21 LAB — TSH: TSH: 1.71 mIU/L

## 2021-06-22 DIAGNOSIS — H60549 Acute eczematoid otitis externa, unspecified ear: Secondary | ICD-10-CM | POA: Insufficient documentation

## 2021-06-22 NOTE — Assessment & Plan Note (Signed)
Improved w/ addition of Prozac at last visit.  No med changes at this time.  Encouraged pt to keep up w/ counseling to help manage her grief.  Will follow.

## 2021-06-22 NOTE — Assessment & Plan Note (Signed)
Ongoing issue for pt.  Encouraged continued use of Dermotic.

## 2021-07-11 ENCOUNTER — Other Ambulatory Visit: Payer: Self-pay | Admitting: Family Medicine

## 2021-07-11 NOTE — Telephone Encounter (Signed)
UTD on visits, please escribe

## 2021-07-12 ENCOUNTER — Other Ambulatory Visit: Payer: Self-pay | Admitting: Family Medicine

## 2021-07-17 ENCOUNTER — Ambulatory Visit: Payer: 59 | Admitting: Family Medicine

## 2021-07-17 NOTE — Progress Notes (Signed)
[place] Phone: [phone] Subjective:   Lynn [name], am serving as a scribe for Lynn [name]. This visit occurred during the SARS-CoV-2 public health emergency.  Safety protocols were in place, including screening questions prior to the visit, additional usage of staff PPE, and extensive cleaning of exam room while observing appropriate contact time as indicated for disinfecting solutions.   I'm seeing this patient by the request  of:  Lynn Minium, MD  CC: Low back pain follow-up  [id number]  03/07/2021 Chronic problem with mild exacerbation with patient not responding well only because patient has not been able to take care of herself recently.  We discussed with her having the loss of a family member that if any counseling is necessary we are able to help.  Patient states that she is already been in contact with a counselor and is awaiting for scheduling.  Patient does have the meloxicam for breakthrough pain.  We will hold on any other medications at the moment.  Follow-up with me again 5 to 6 weeks  Updated 07/21/2021 Lynn [name] is a 35 y.o. female coming in with complaint of back pain. Epi on 06/19/2021. States that she had relief for 2-3 weeks. Pain has returned in lower back, hip and pelvic pain which moves from R to L.  Patient states that for the first 2 weeks noted had nearly complete resolution of pain which did not make patient feel pretty good.  Patient states that he is frustrated that the pain is completely back at this time. Has had some increase in anxiety.  Patient's primary care did put her on Prozac but is concerned she is having some sexual side effects.     Past Medical History:  Diagnosis Date   Anxiety    Cardiac arrhythmia    Depression    PPD   History of chicken pox    Pregnancy induced hypertension    UTI (lower urinary tract infection)    Past Surgical  History:  Procedure Laterality Date   CESAREAN SECTION N/A 06/01/2017   Procedure: CESAREAN SECTION;  Surgeon: Lynn Farrier, MD;  Location: [facility];  Service: Obstetrics;  Laterality: N/A;   CESAREAN SECTION N/A 01/07/2020   Procedure: CESAREAN SECTION;  Surgeon: Lynn Hedges, DO;  Location: [facility];  Service: Obstetrics;  Laterality: N/A;  REPEAT EDC 01/28/20 NKDA NEED RNFA   TONSILLECTOMY     Social History   Socioeconomic History   Marital status: Married    Spouse name: Not on file   Number of children: Not on file   Years of education: Not on file   Highest education level: Not on file  Occupational History   Not on file  Tobacco Use   Smoking status: Former    Packs/day: 0.25    Types: Cigarettes   Smokeless tobacco: Never  Vaping Use   Vaping Use: Never used  Substance and Sexual Activity   Alcohol use: Yes    Comment: weekly   Drug use: [name]   Sexual activity: Not on file  Other Topics Concern   Not on file  Social History Narrative   Not on file   Social Determinants of Health   Financial Resource Strain: Not on file  Food Insecurity: Not on file  Transportation Needs: Not on file  Physical Activity: Not on file  Stress: Not on file  Social Connections: Not on file   Allergies  Allergen Reactions   Latex     Pt "prefers" [name] latex, but not an allergy Sensitive skin   Family History  Problem Relation Age of Onset   Breast cancer Maternal Grandmother        possibly paternal also   Hyperlipidemia Father    Heart disease Father    Hypertension Father    Hyperlipidemia Mother    Heart disease Mother    Hypertension Mother    Pancreatic cancer Mother    Kidney disease Paternal Grandfather    Cancer Maternal Grandfather        kidney removal due to mass       Current Outpatient Medications (Analgesics):    ibuprofen (ADVIL) 800 MG tablet, Take 1 tablet (800 mg total) by mouth every 8 (eight) hours as needed.   meloxicam (MOBIC) 15  MG tablet, TAKE 1 TABLET BY MOUTH EVERY DAY   Current Outpatient Medications (Other):    ALPRAZolam (XANAX) 0.25 MG tablet, Take 1 tablet (0.25 mg total) by mouth 2 (two) times daily as needed for anxiety.   buPROPion (WELLBUTRIN XL) 150 MG 24 hr tablet, TAKE 1 TABLET BY MOUTH EVERY DAY   busPIRone (BUSPAR) 5 MG tablet, Take 5 mg by mouth 2 (two) times daily.   clotrimazole-betamethasone (LOTRISONE) cream, Apply 1 application topically 2 (two) times daily.   DULoxetine (CYMBALTA) 20 MG capsule, Take 1 capsule (20 mg total) by mouth daily.   Fluocinolone Acetonide 0.01 % OIL, Place 5 drops in ear(s) 2 (two) times daily. Use for 7-14 days as needed   FLUoxetine (PROZAC) 10 MG capsule, TAKE 1 CAPSULE BY MOUTH EVERY DAY    Review of Systems:  [name] headache, visual changes, nausea, vomiting, diarrhea, constipation, dizziness, abdominal pain, skin rash, fevers, chills, night sweats, weight loss, swollen lymph nodes,  joint swelling, chest pain, shortness of breath, mood changes. POSITIVE muscle aches, body aches  Objective  Blood pressure (!) 128/94, pulse 88, height 5\' 4"  (1.626 m), weight 158 lb (71.7 kg), SpO2 99 %, not currently breastfeeding.   General: [name] apparent distress alert and oriented x3 mood and affect normal, dressed appropriately.  HEENT: Pupils equal, extraocular movements intact  Respiratory: Patient's speak in full sentences and does not appear short of breath  Cardiovascular: [name] lower extremity edema, non tender, [name] erythema  Gait normal with good balance and coordination.  MSK: Low back exam does have tenderness to palpation over the left sacroiliac joint and the paraspinal musculature of the lumbar spine left greater than right.  Worsening pain with extension of the back.  Patient is able to sit fairly comfortably.    Impression and Recommendations:     The above documentation has been reviewed and is accurate and complete Lynn Pulley, DO

## 2021-07-21 ENCOUNTER — Encounter: Payer: Self-pay | Admitting: Family Medicine

## 2021-07-21 ENCOUNTER — Other Ambulatory Visit: Payer: Self-pay

## 2021-07-21 ENCOUNTER — Ambulatory Visit: Payer: 59 | Admitting: Family Medicine

## 2021-07-21 VITALS — BP 128/94 | HR 88 | Ht 64.0 in | Wt 158.0 lb

## 2021-07-21 DIAGNOSIS — M4306 Spondylolysis, lumbar region: Secondary | ICD-10-CM | POA: Diagnosis not present

## 2021-07-21 DIAGNOSIS — M5416 Radiculopathy, lumbar region: Secondary | ICD-10-CM

## 2021-07-21 MED ORDER — DULOXETINE HCL 20 MG PO CPEP: 20.0000 mg | ORAL_CAPSULE | Freq: Every day | ORAL | 3 refills | Status: DC

## 2021-07-21 NOTE — Assessment & Plan Note (Signed)
Patient does have a pars defect noted of the low back.  Patient does have some mild edema noted to the left sacroiliac joint we will need to consider as well.  At this point patient did have some improvement with the epidural in October for 2 to 3 weeks of near complete resolution of the pain and I do feel that another epidural at the L5-S1 could be beneficial.  We discussed again about different medications.  Patient has been on Prozac previously but did just restarted.  Did discuss with patient about changing to Cymbalta to help with some of the pain relief as well as help with some of the anxiety and depression.  Patient would like to make that change.  We will send a message to patient's primary care provider to make sure that she is okay with this medication change.  In addition to this discussed with patient about the potential for sacroiliac joint injection again and follow-up as needed.  We will also likely start osteopathic manipulation injection at follow-up in 4 to 6 weeks.

## 2021-07-21 NOTE — Patient Instructions (Addendum)
Cymbalta 20 mg stop Prozac Epidural [phone] See me again in 5-6 weeks

## 2021-08-11 ENCOUNTER — Other Ambulatory Visit: Payer: Self-pay | Admitting: Family Medicine

## 2021-08-18 ENCOUNTER — Other Ambulatory Visit: Payer: Self-pay

## 2021-08-18 ENCOUNTER — Ambulatory Visit
Admission: RE | Admit: 2021-08-18 | Discharge: 2021-08-18 | Disposition: A | Discharge status: Home / Self Care | Payer: 59 | Source: Ambulatory Visit | Attending: Family Medicine | Admitting: Family Medicine

## 2021-08-18 DIAGNOSIS — M5416 Radiculopathy, lumbar region: Secondary | ICD-10-CM

## 2021-08-18 MED ORDER — IOPAMIDOL (ISOVUE-M 200) INJECTION 41%
1.0000 mL | Freq: Once | INTRAMUSCULAR | Status: AC
  Administered 2021-08-18: 1 mL via EPIDURAL

## 2021-08-18 MED ORDER — METHYLPREDNISOLONE ACETATE 40 MG/ML INJ SUSP (RADIOLOG
120.0000 mg | Freq: Once | INTRAMUSCULAR | Status: AC
  Administered 2021-08-18: 120 mg via EPIDURAL

## 2021-08-18 NOTE — Discharge Instructions (Signed)

## 2021-08-29 NOTE — Progress Notes (Signed)
[place] Phone: [phone] Subjective:   Lynn Bell, am serving as a scribe for Dr. Hulan Saas. This visit occurred during the SARS-CoV-2 public health emergency.  Safety protocols were in place, including screening questions prior to the visit, additional usage of staff PPE, and extensive cleaning of exam room while observing appropriate contact time as indicated for disinfecting solutions.   I'm seeing this patient by the request  of:  Midge Minium, MD  CC: Low back pain follow-up  [id number]  07/21/2021 Patient does have a pars defect noted of the low back.  Patient does have some mild edema noted to the left sacroiliac joint we will need to consider as well.  At this point patient did have some improvement with the epidural in October for 2 to 3 weeks of near complete resolution of the pain and I do feel that another epidural at the L5-S1 could be beneficial.  We discussed again about different medications.  Patient has been on Prozac previously but did just restarted.  Did discuss with patient about changing to Cymbalta to help with some of the pain relief as well as help with some of the anxiety and depression.  Patient would like to make that change.  We will send a message to patient's primary care provider to make sure that she is okay with this medication change.  In addition to this discussed with patient about the potential for sacroiliac joint injection again and follow-up as needed.  We will also likely start osteopathic manipulation injection at follow-up in 4 to 6 weeks.  Update 09/01/2021 Lynn Bell is a 35 y.o. female coming in with complaint of lumbar spine pain. Epidural on 08/18/2021. Patient states that she has had more days where her back has bothered her than not. Achy pain today on L lumbar and into L lateral hip. Using Cymbalta which is helping anxiety but not pain. Is not using  meloxicam.  Patient feels like the Cymbalta has helped some with her mood but not quite as much of the pain.  Feels like the injection did not help significantly anything else at this time.     Past Medical History:  Diagnosis Date   Anxiety    Cardiac arrhythmia    Depression    PPD   History of chicken pox    Pregnancy induced hypertension    UTI (lower urinary tract infection)    Past Surgical History:  Procedure Laterality Date   CESAREAN SECTION N/A 06/01/2017   Procedure: CESAREAN SECTION;  Surgeon: Everlene Farrier, MD;  Location: [facility];  Service: Obstetrics;  Laterality: N/A;   CESAREAN SECTION N/A 01/07/2020   Procedure: CESAREAN SECTION;  Surgeon: Linda Hedges, DO;  Location: [facility];  Service: Obstetrics;  Laterality: N/A;  REPEAT EDC 01/28/20 NKDA NEED RNFA   TONSILLECTOMY     Social History   Socioeconomic History   Marital status: Married    Spouse name: Not on file   Number of children: Not on file   Years of education: Not on file   Highest education level: Not on file  Occupational History   Not on file  Tobacco Use   Smoking status: Former    Packs/day: 0.25    Types: Cigarettes   Smokeless tobacco: Never  Vaping Use   Vaping Use: Never used  Substance and Sexual Activity   Alcohol use: Yes    Comment: weekly  Drug use: Bell   Sexual activity: Not on file  Other Topics Concern   Not on file  Social History Narrative   Not on file   Social Determinants of Health   Financial Resource Strain: Not on file  Food Insecurity: Not on file  Transportation Needs: Not on file  Physical Activity: Not on file  Stress: Not on file  Social Connections: Not on file   Allergies  Allergen Reactions   Latex     Pt "prefers" Bell latex, but not an allergy Sensitive skin   Family History  Problem Relation Age of Onset   Breast cancer Maternal Grandmother        possibly paternal also   Hyperlipidemia Father    Heart disease Father     Hypertension Father    Hyperlipidemia Mother    Heart disease Mother    Hypertension Mother    Pancreatic cancer Mother    Kidney disease Paternal Grandfather    Cancer Maternal Grandfather        kidney removal due to mass       Current Outpatient Medications (Analgesics):    ibuprofen (ADVIL) 800 MG tablet, Take 1 tablet (800 mg total) by mouth every 8 (eight) hours as needed.   meloxicam (MOBIC) 15 MG tablet, TAKE 1 TABLET BY MOUTH EVERY DAY   Current Outpatient Medications (Other):    ALPRAZolam (XANAX) 0.25 MG tablet, Take 1 tablet (0.25 mg total) by mouth 2 (two) times daily as needed for anxiety.   buPROPion (WELLBUTRIN XL) 150 MG 24 hr tablet, TAKE 1 TABLET BY MOUTH EVERY DAY   busPIRone (BUSPAR) 5 MG tablet, Take 5 mg by mouth 2 (two) times daily.   clotrimazole-betamethasone (LOTRISONE) cream, Apply 1 application topically 2 (two) times daily.   DULoxetine (CYMBALTA) 20 MG capsule, Take 1 capsule (20 mg total) by mouth daily.   Fluocinolone Acetonide 0.01 % OIL, Place 5 drops in ear(s) 2 (two) times daily. Use for 7-14 days as needed   FLUoxetine (PROZAC) 10 MG capsule, TAKE 1 CAPSULE BY MOUTH EVERY DAY   Reviewed prior external information including notes and imaging from  primary care provider As well as notes that were available from care everywhere and other healthcare systems.  Past medical history, social, surgical and family history all reviewed in electronic medical record.  Bell pertanent information unless stated regarding to the chief complaint.   Review of Systems:  Bell headache, visual changes, nausea, vomiting, diarrhea, constipation, dizziness, abdominal pain, skin rash, fevers, chills, night sweats, weight loss, swollen lymph nodes, body aches, joint swelling, chest pain, shortness of breath, mood changes. POSITIVE muscle aches  Objective  Blood pressure 114/90, pulse (!) 112, height 5\' 4"  (1.626 m), weight 152 lb (68.9 kg), SpO2 99 %, not currently  breastfeeding.   General: Bell apparent distress alert and oriented x3 mood and affect normal, dressed appropriately.  HEENT: Pupils equal, extraocular movements intact  Respiratory: Patient's speak in full sentences and does not appear short of breath  Cardiovascular: Bell lower extremity edema, non tender, Bell erythema  Gait normal with good balance and coordination.  MSK: Back exam does have some loss of lordosis.  Some tightness noted with straight leg test more on the left.  The radicular symptoms do seem to be in the L4 distribution.  Bell weakness of the lower extremity noted.   Impression and Recommendations:    The above documentation has been reviewed and is accurate and complete [name], DO

## 2021-09-01 ENCOUNTER — Other Ambulatory Visit: Payer: Self-pay

## 2021-09-01 ENCOUNTER — Ambulatory Visit: Payer: 59 | Admitting: Family Medicine

## 2021-09-01 ENCOUNTER — Encounter: Payer: Self-pay | Admitting: Family Medicine

## 2021-09-01 VITALS — BP 114/90 | HR 112 | Ht 64.0 in | Wt 152.0 lb

## 2021-09-01 DIAGNOSIS — M533 Sacrococcygeal disorders, not elsewhere classified: Secondary | ICD-10-CM

## 2021-09-01 DIAGNOSIS — M4306 Spondylolysis, lumbar region: Secondary | ICD-10-CM | POA: Diagnosis not present

## 2021-09-01 DIAGNOSIS — M2550 Pain in unspecified joint: Secondary | ICD-10-CM

## 2021-09-01 LAB — COMPREHENSIVE METABOLIC PANEL
ALT: 16 U/L (ref 0–35)
AST: 22 U/L (ref 0–37)
Albumin: 4.5 g/dL (ref 3.5–5.2)
Alkaline Phosphatase: 54 U/L (ref 39–117)
BUN: 13 mg/dL (ref 6–23)
CO2: 28 mEq/L (ref 19–32)
Calcium: 9.5 mg/dL (ref 8.4–10.5)
Chloride: 101 mEq/L (ref 96–112)
Creatinine, Ser: 0.73 mg/dL (ref 0.40–1.20)
GFR: 105.88 mL/min (ref >60.00)
Glucose, Bld: 83 mg/dL (ref 70–99)
Potassium: 4.1 mEq/L (ref 3.5–5.1)
Sodium: 137 mEq/L (ref 135–145)
Total Bilirubin: 0.5 mg/dL (ref 0.2–1.2)
Total Protein: 7.4 g/dL (ref 6.0–8.3)

## 2021-09-01 LAB — CBC WITH DIFFERENTIAL/PLATELET
Basophils Absolute: 0 10*3/uL (ref 0.0–0.1)
Basophils Relative: 0.7 % (ref 0.0–3.0)
Eosinophils Absolute: 0.2 10*3/uL (ref 0.0–0.7)
Eosinophils Relative: 4.6 % (ref 0.0–5.0)
HCT: 39.3 % (ref 36.0–46.0)
Hemoglobin: 13.2 g/dL (ref 12.0–15.0)
Lymphocytes Relative: 29.1 % (ref 12.0–46.0)
Lymphs Abs: 1.4 10*3/uL (ref 0.7–4.0)
MCHC: 33.7 g/dL (ref 30.0–36.0)
MCV: 91.8 fl (ref 78.0–100.0)
Monocytes Absolute: 0.5 10*3/uL (ref 0.1–1.0)
Monocytes Relative: 10.7 % (ref 3.0–12.0)
Neutro Abs: 2.7 10*3/uL (ref 1.4–7.7)
Neutrophils Relative %: 54.9 % (ref 43.0–77.0)
Platelets: 287 10*3/uL (ref 150.0–400.0)
RBC: 4.28 Mil/uL (ref 3.87–5.11)
RDW: 13.2 % (ref 11.5–15.5)
WBC: 4.9 10*3/uL (ref 4.0–10.5)

## 2021-09-01 LAB — IBC PANEL
Iron: 110 ug/dL (ref 42–145)
Saturation Ratios: 26.4 % (ref 20.0–50.0)
TIBC: 417.2 ug/dL (ref 250.0–450.0)
Transferrin: 298 mg/dL (ref 212.0–360.0)

## 2021-09-01 LAB — VITAMIN B12: Vitamin B-12: 392 pg/mL (ref 211–911)

## 2021-09-01 LAB — VITAMIN D 25 HYDROXY (VIT D DEFICIENCY, FRACTURES): VITD: 38.3 ng/mL (ref 30.00–100.00)

## 2021-09-01 LAB — FERRITIN: Ferritin: 40.7 ng/mL (ref 10.0–291.0)

## 2021-09-01 NOTE — Patient Instructions (Addendum)
Labs today Increase Cymbalta to 40mg  Discontinue Wellbutrin DHEA 50mg  for 4 weeks  See you again in 5 weeks

## 2021-09-01 NOTE — Assessment & Plan Note (Signed)
Continues to have the pars defect.  Do think that this could be contributing to some of the nerve root difficulty.  Patient does have findings of the L4 nerve root impingement that does correspond with some of her pain.  Would like patient to try medial branch block on the left side to see how patient responds.  Depending on findings could be a potential candidate for radiofrequency ablation.  We will get other laboratory work-up to rule out anything else that could be contributing.  Follow-up again in 4 to 8 weeks

## 2021-09-16 ENCOUNTER — Other Ambulatory Visit: Payer: Self-pay | Admitting: Family Medicine

## 2021-09-16 ENCOUNTER — Ambulatory Visit
Admission: RE | Admit: 2021-09-16 | Discharge: 2021-09-16 | Disposition: A | Discharge status: Home / Self Care | Payer: 59 | Source: Ambulatory Visit | Attending: Family Medicine | Admitting: Family Medicine

## 2021-09-16 DIAGNOSIS — M4306 Spondylolysis, lumbar region: Secondary | ICD-10-CM

## 2021-09-16 NOTE — Discharge Instructions (Signed)

## 2021-09-18 ENCOUNTER — Other Ambulatory Visit: Payer: Self-pay | Admitting: Family Medicine

## 2021-09-18 DIAGNOSIS — M4306 Spondylolysis, lumbar region: Secondary | ICD-10-CM

## 2021-10-01 ENCOUNTER — Ambulatory Visit
Admission: RE | Admit: 2021-10-01 | Discharge: 2021-10-01 | Disposition: A | Discharge status: Home / Self Care | Payer: 59 | Source: Ambulatory Visit | Attending: Family Medicine | Admitting: Family Medicine

## 2021-10-01 ENCOUNTER — Other Ambulatory Visit: Payer: Self-pay

## 2021-10-01 DIAGNOSIS — M4306 Spondylolysis, lumbar region: Secondary | ICD-10-CM

## 2021-10-01 IMAGING — XA DG FACET JT NEURO DESTRUCT EA ADD L/S W/ IMAG GUIDE
5 series · 5 of 5 positions shown · non-contrast
Comparison: none

CLINICAL DATA: Facet mediated low back pain. Excellent response to
left L5-S1 facet medial branch blocks 2 weeks ago. Patient presents
for RFA.
TECHNIQUE: The procedure, risks, benefits, and alternatives were explained to
the patient. Questions regarding the procedure were encouraged and
answered. The patient understands and consents to the procedure.

[Series 4: ortho standard · 1 of 1 slices shown (1 of 5)]
[im 1/1]
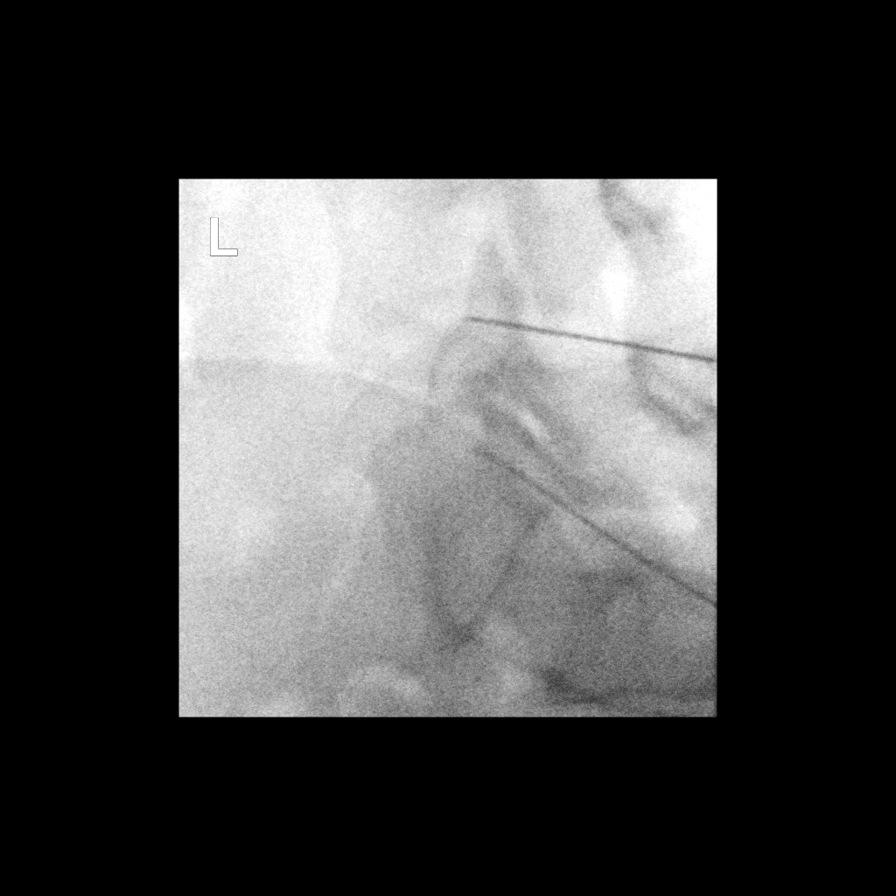

[Series 5: ortho standard · 1 of 1 slices shown (2 of 5)]
[im 1/1]
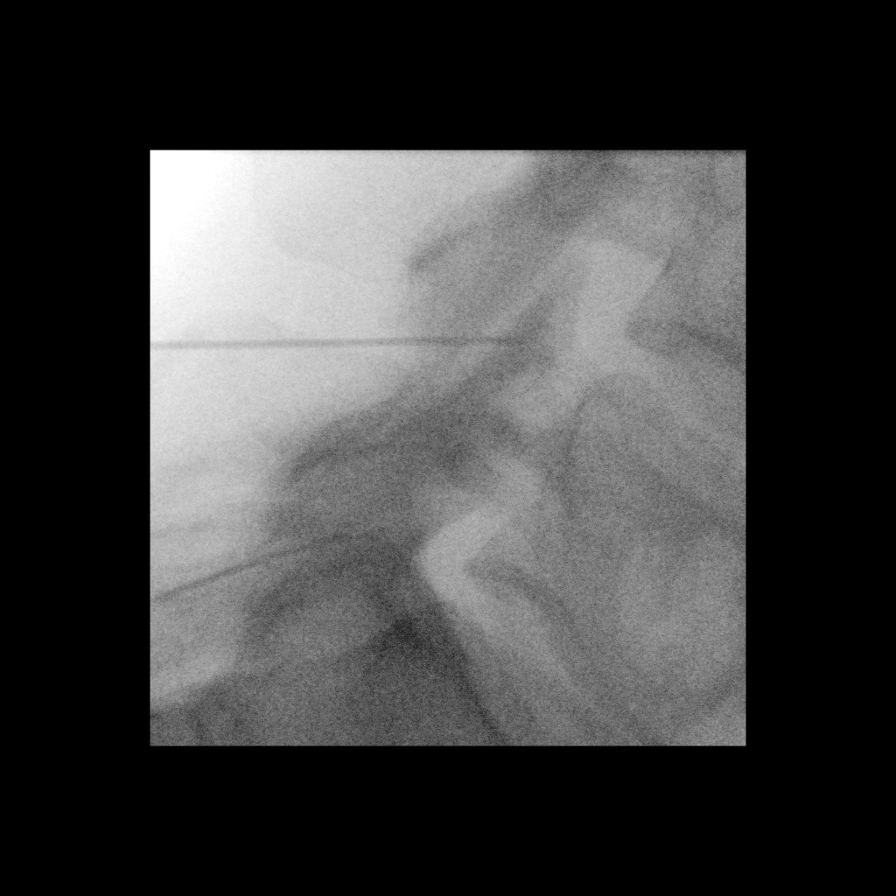

[Series 6: ortho standard · 1 of 1 slices shown (3 of 5)]
[im 1/1]
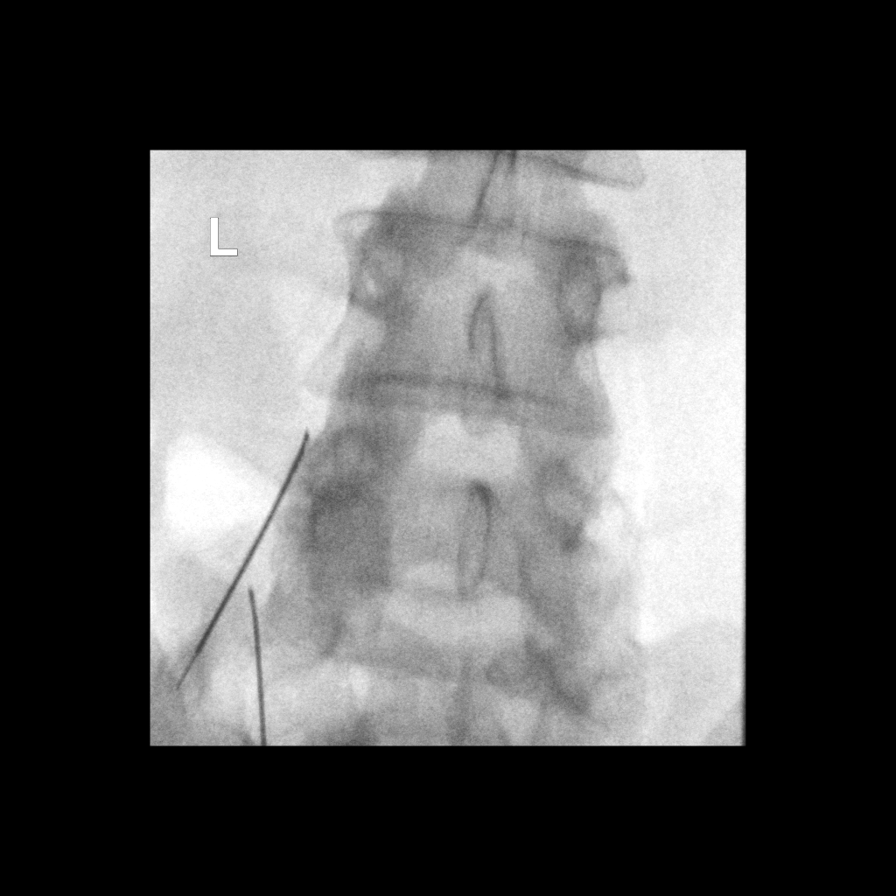

[Series 7: ortho standard · 1 of 1 slices shown (4 of 5)]
[im 1/1]
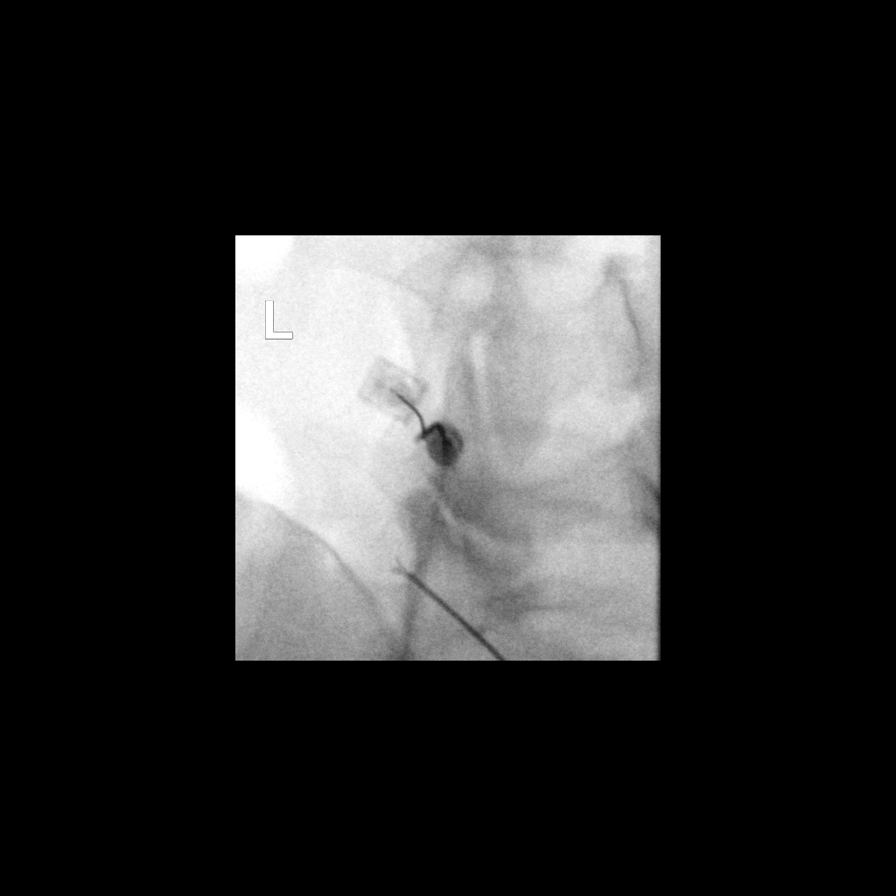

[Series 8: ortho standard · 1 of 1 slices shown (5 of 5)]
[im 1/1]
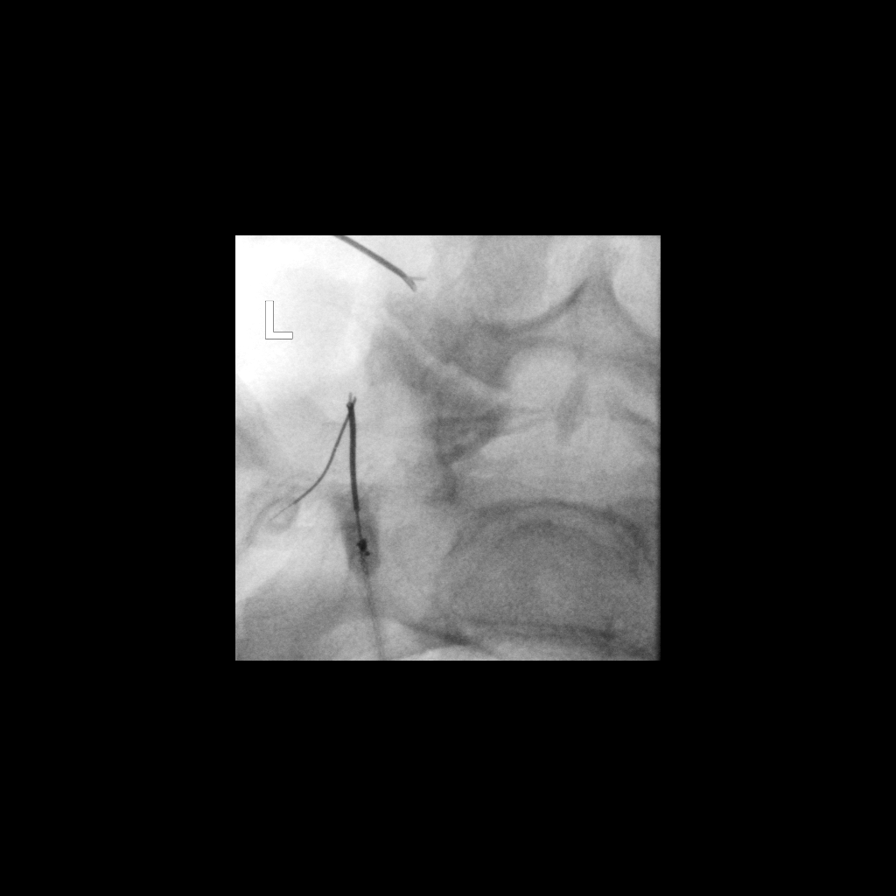

[5 of 5 positions shown; findings below may reference images not displayed]

EXAM:
FLUOROSCOPICALLY GUIDED FACET RADIOFREQUENCY ABLATION OF THE LEFT
L5-S1 FACET JOINT

FLUOROSCOPY TIME:  Radiation Exposure Index (as provided by the
fluoroscopic device): 18.4 mGy

Fluoroscopy Time:  1 minute, 53 seconds

Number of Acquired Images:  0

SEDATION:
Moderate (conscious) sedation was employed during this procedure. A
total of Versed 3 mg and Fentanyl 50 mcg were administered
intravenously. 30 mg Toradol IV were administered at the conclusion
of the procedure.

Moderate Sedation Time: 34 minutes. The patient's level of
consciousness and vital signs were monitored continuously by
radiology nursing throughout the procedure under my direct
supervision.
A grounding pad was placed on the right leg and confirmed visually.

An appropriate skin entry site was determined fluoroscopically and
marked. Site prepped with betadine, draped in usual sterile fashion,
and infiltrated locally with 1% lidocaine.

LEFT L4 MEDIAL BRANCH RFA: A posterior oblique approach was taken to
the junction of the superior articular process and transverse
process on the left at L5 using a 20 gauge 10 cm [REDACTED] needle, to
lie along the course of the left L4 medial branch nerve. The 10 mm
active tip probe was placed through the needle, with subsequent
detection of low impedance. Sensory and motor stimulation elicited
concordant symptoms at low voltage, without lower extremity sensory
or motor symptoms. Using a tandem needle, 1 mL of 1% lidocaine was
placed around the probe tip to mitigate the acute symptoms of the
ablation.

LEFT L5 DORSAL RAMUS RFA: A posterior oblique approach was taken to
the junction of the S1 superior articular process and sacral ala on
the left using a 20 gauge 10 cm [REDACTED] needle, to lie along the
course of the left L5 dorsal ramus. The 10 mm active tip probe was
placed through the needle, with subsequent detection of low
impedance. Sensory and motor stimulation elicited concordant
symptoms at low voltage, without lower extremity sensory or motor
symptoms. Using a tandem needle, 1 mL of 1% lidocaine was placed
around the probe tip to mitigate the acute symptoms of the ablation.

Finally, the lesions were created uneventfully for 90 seconds at 80
degrees Celsius. Afterwards 40 mg Depo-Medrol mixed with 0.5 mL of
1% lidocaine were administered at the ablation sites. The needles
were subsequently removed.

The procedure was well-tolerated. After the procedure, the patient
was able to ambulate without difficulty, leg weakness, or numbness.
IMPRESSION: 1.  Technically successful left L5-S1 facet radiofrequency ablation.

## 2021-10-01 MED ORDER — METHYLPREDNISOLONE ACETATE 40 MG/ML INJ SUSP (RADIOLOG
80.0000 mg | Freq: Once | INTRAMUSCULAR | Status: AC
  Administered 2021-10-01: 80 mg via INTRALESIONAL

## 2021-10-01 MED ORDER — SODIUM CHLORIDE 0.9 % IV SOLN: INTRAVENOUS | Status: DC

## 2021-10-01 MED ORDER — MIDAZOLAM HCL 2 MG/2ML IJ SOLN
1.0000 mg | INTRAMUSCULAR | Status: DC | PRN
  Administered 2021-10-01: 0.5 mg via INTRAVENOUS
  Administered 2021-10-01 (×2): 1 mg via INTRAVENOUS

## 2021-10-01 MED ORDER — KETOROLAC TROMETHAMINE 30 MG/ML IJ SOLN
30.0000 mg | Freq: Once | INTRAMUSCULAR | Status: AC
  Administered 2021-10-01: 30 mg via INTRAVENOUS

## 2021-10-01 MED ORDER — FENTANYL CITRATE PF 50 MCG/ML IJ SOSY
25.0000 ug | PREFILLED_SYRINGE | INTRAMUSCULAR | Status: DC | PRN
  Administered 2021-10-01: 50 ug via INTRAVENOUS

## 2021-10-01 NOTE — Discharge Instructions (Signed)
Radio Frequency Ablation Post Procedure Discharge Instructions ? ?May resume a regular diet and any medications that you routinely take (including pain medications). ?No driving day of procedure. ?Upon discharge go home and rest for at least 4 hours.  May use an ice pack as needed to injection sites on back. ?Remove bandades later, today. ? ? ? ?Please contact our office at [phone] for the following symptoms: ? ?Fever greater than 100 degrees ?Increased swelling, pain, or redness at injection site. ? ? ?Thank you for visiting [facility].  ?

## 2021-10-01 NOTE — Progress Notes (Signed)
Pt back in nursing recovery area. Pt alert and calm, alert and oriented x4, ambulated to the bathroom without difficulty and has no complaints at this time. Pt will be monitored until discharged by Radiologist.

## 2021-10-07 NOTE — Progress Notes (Deleted)
[place] Phone: [phone] Subjective:    I'm seeing this patient by the request  of:  Birdie Riddle Aundra Millet, MD  CC:   [id number]  Lynn Bell is a 36 y.o. female coming in with complaint of LBP. Medical Branch Block 10/01/2021. Patient states       Past Medical History:  Diagnosis Date   Anxiety    Cardiac arrhythmia    Depression    PPD   History of chicken pox    Pregnancy induced hypertension    UTI (lower urinary tract infection)    Past Surgical History:  Procedure Laterality Date   CESAREAN SECTION N/A 06/01/2017   Procedure: CESAREAN SECTION;  Surgeon: Everlene Farrier, MD;  Location: [facility];  Service: Obstetrics;  Laterality: N/A;   CESAREAN SECTION N/A 01/07/2020   Procedure: CESAREAN SECTION;  Surgeon: Linda Hedges, DO;  Location: [facility];  Service: Obstetrics;  Laterality: N/A;  REPEAT EDC 01/28/20 NKDA NEED RNFA   TONSILLECTOMY     Social History   Socioeconomic History   Marital status: Married    Spouse name: Not on file   Number of children: Not on file   Years of education: Not on file   Highest education level: Not on file  Occupational History   Not on file  Tobacco Use   Smoking status: Former    Packs/day: 0.25    Types: Cigarettes   Smokeless tobacco: Never  Vaping Use   Vaping Use: Never used  Substance and Sexual Activity   Alcohol use: Yes    Comment: weekly   Drug use: No   Sexual activity: Not on file  Other Topics Concern   Not on file  Social History Narrative   Not on file   Social Determinants of Health   Financial Resource Strain: Not on file  Food Insecurity: Not on file  Transportation Needs: Not on file  Physical Activity: Not on file  Stress: Not on file  Social Connections: Not on file   Allergies  Allergen Reactions   Latex     Pt "prefers" no latex, but not an allergy Sensitive skin   Family History  Problem  Relation Age of Onset   Breast cancer Maternal Grandmother        possibly paternal also   Hyperlipidemia Father    Heart disease Father    Hypertension Father    Hyperlipidemia Mother    Heart disease Mother    Hypertension Mother    Pancreatic cancer Mother    Kidney disease Paternal Grandfather    Cancer Maternal Grandfather        kidney removal due to mass       Current Outpatient Medications (Analgesics):    ibuprofen (ADVIL) 800 MG tablet, Take 1 tablet (800 mg total) by mouth every 8 (eight) hours as needed.   meloxicam (MOBIC) 15 MG tablet, TAKE 1 TABLET BY MOUTH EVERY DAY   Current Outpatient Medications (Other):    ALPRAZolam (XANAX) 0.25 MG tablet, Take 1 tablet (0.25 mg total) by mouth 2 (two) times daily as needed for anxiety.   buPROPion (WELLBUTRIN XL) 150 MG 24 hr tablet, TAKE 1 TABLET BY MOUTH EVERY DAY   busPIRone (BUSPAR) 5 MG tablet, Take 5 mg by mouth 2 (two) times daily.   clotrimazole-betamethasone (LOTRISONE) cream, Apply 1 application topically 2 (two) times daily.   DULoxetine (CYMBALTA) 20 MG capsule, Take 1 capsule (20  mg total) by mouth daily.   Fluocinolone Acetonide 0.01 % OIL, Place 5 drops in ear(s) 2 (two) times daily. Use for 7-14 days as needed   FLUoxetine (PROZAC) 10 MG capsule, TAKE 1 CAPSULE BY MOUTH EVERY DAY   Reviewed prior external information including notes and imaging from  primary care provider As well as notes that were available from care everywhere and other healthcare systems.  Past medical history, social, surgical and family history all reviewed in electronic medical record.  No pertanent information unless stated regarding to the chief complaint.   Review of Systems:  No headache, visual changes, nausea, vomiting, diarrhea, constipation, dizziness, abdominal pain, skin rash, fevers, chills, night sweats, weight loss, swollen lymph nodes, body aches, joint swelling, chest pain, shortness of breath, mood changes. POSITIVE  muscle aches  Objective  Last menstrual period 10/01/2021, not currently breastfeeding.   General: No apparent distress alert and oriented x3 mood and affect normal, dressed appropriately.  HEENT: Pupils equal, extraocular movements intact  Respiratory: Patient's speak in full sentences and does not appear short of breath  Cardiovascular: No lower extremity edema, non tender, no erythema  Gait normal with good balance and coordination.  MSK:  Non tender with full range of motion and good stability and symmetric strength and tone of shoulders, elbows, wrist, hip, knee and ankles bilaterally.     Impression and Recommendations:     The above documentation has been reviewed and is accurate and complete [name]

## 2021-10-08 ENCOUNTER — Ambulatory Visit: Payer: 59 | Admitting: Family Medicine

## 2021-10-14 NOTE — Progress Notes (Signed)
[place] Phone: [phone] Subjective:   Lynn Bell, am serving as a scribe for Dr. Hulan Saas. This visit occurred during the SARS-CoV-2 public health emergency.  Safety protocols were in place, including screening questions prior to the visit, additional usage of staff PPE, and extensive cleaning of exam room while observing appropriate contact time as indicated for disinfecting solutions.   I'm seeing this patient by the request  of:  Midge Minium, MD  CC: low back pain   [id number]  09/01/2021 Continues to have the pars defect.  Do think that this could be contributing to some of the nerve root difficulty.  Patient does have findings of the L4 nerve root impingement that does correspond with some of her pain.  Would like patient to try medial branch block on the left side to see how patient responds.  Depending on findings could be a potential candidate for radiofrequency ablation.  We will get other laboratory work-up to rule out anything else that could be contributing.  Follow-up again in 4 to 8 weeks  Update 10/15/2021 Lynn Bell is a 36 y.o. female coming in with complaint of LBP. RFA on 10/01/2021. Helped with back but not hp pain. Patient states that she continues to have hip pain that is deep. Pain increased on Wed and Sat to 10/10. Pain in both anterior and posterior aspect. Relief when pelvic tilt. Patient continues to use Cymbalta. Was not able to increase dose due to not getting medication called in. Using Wellbutrin since she did not get higher dose of Cymbalta.        Past Medical History:  Diagnosis Date   Anxiety    Cardiac arrhythmia    Depression    PPD   History of chicken pox    Pregnancy induced hypertension    UTI (lower urinary tract infection)    Past Surgical History:  Procedure Laterality Date   CESAREAN SECTION N/A 06/01/2017   Procedure: CESAREAN SECTION;   Surgeon: Everlene Farrier, MD;  Location: [facility];  Service: Obstetrics;  Laterality: N/A;   CESAREAN SECTION N/A 01/07/2020   Procedure: CESAREAN SECTION;  Surgeon: Linda Hedges, DO;  Location: [facility];  Service: Obstetrics;  Laterality: N/A;  REPEAT EDC 01/28/20 NKDA NEED RNFA   TONSILLECTOMY     Social History   Socioeconomic History   Marital status: Married    Spouse name: Not on file   Number of children: Not on file   Years of education: Not on file   Highest education level: Not on file  Occupational History   Not on file  Tobacco Use   Smoking status: Former    Packs/day: 0.25    Types: Cigarettes   Smokeless tobacco: Never  Vaping Use   Vaping Use: Never used  Substance and Sexual Activity   Alcohol use: Yes    Comment: weekly   Drug use: Bell   Sexual activity: Not on file  Other Topics Concern   Not on file  Social History Narrative   Not on file   Social Determinants of Health   Financial Resource Strain: Not on file  Food Insecurity: Not on file  Transportation Needs: Not on file  Physical Activity: Not on file  Stress: Not on file  Social Connections: Not on file   Allergies  Allergen Reactions   Latex     Pt "prefers" Bell latex, but not an allergy Sensitive  skin   Family History  Problem Relation Age of Onset   Breast cancer Maternal Grandmother        possibly paternal also   Hyperlipidemia Father    Heart disease Father    Hypertension Father    Hyperlipidemia Mother    Heart disease Mother    Hypertension Mother    Pancreatic cancer Mother    Kidney disease Paternal Grandfather    Cancer Maternal Grandfather        kidney removal due to mass       Current Outpatient Medications (Analgesics):    ibuprofen (ADVIL) 800 MG tablet, Take 1 tablet (800 mg total) by mouth every 8 (eight) hours as needed.   meloxicam (MOBIC) 15 MG tablet, TAKE 1 TABLET BY MOUTH EVERY DAY   Current Outpatient Medications (Other):    ALPRAZolam  (XANAX) 0.25 MG tablet, Take 1 tablet (0.25 mg total) by mouth 2 (two) times daily as needed for anxiety.   buPROPion (WELLBUTRIN XL) 150 MG 24 hr tablet, TAKE 1 TABLET BY MOUTH EVERY DAY   busPIRone (BUSPAR) 5 MG tablet, Take 5 mg by mouth 2 (two) times daily.   clotrimazole-betamethasone (LOTRISONE) cream, Apply 1 application topically 2 (two) times daily.   DULoxetine (CYMBALTA) 20 MG capsule, Take 2 capsules (40 mg total) by mouth daily.   Fluocinolone Acetonide 0.01 % OIL, Place 5 drops in ear(s) 2 (two) times daily. Use for 7-14 days as needed   FLUoxetine (PROZAC) 10 MG capsule, TAKE 1 CAPSULE BY MOUTH EVERY DAY   Reviewed prior external information including notes and imaging from  primary care provider As well as notes that were available from care everywhere and other healthcare systems.  Past medical history, social, surgical and family history all reviewed in electronic medical record.  Bell pertanent information unless stated regarding to the chief complaint.   Review of Systems:  Bell headache, visual changes, nausea, vomiting, diarrhea, constipation, dizziness, abdominal pain, skin rash, fevers, chills, night sweats, weight loss, swollen lymph nodes, body aches, joint swelling, chest pain, shortness of breath, mood changes. POSITIVE muscle aches  Objective  Blood pressure 122/86, pulse 80, height 5\' 4"  (1.626 m), weight 156 lb (70.8 kg), last menstrual period 10/01/2021, SpO2 99 %, not currently breastfeeding.   General: Bell apparent distress alert and oriented x3 mood and affect normal, dressed appropriately.  HEENT: Pupils equal, extraocular movements intact  Respiratory: Patient's speak in full sentences and does not appear short of breath  Cardiovascular: Bell lower extremity edema, non tender, Bell erythema  Gait normal with good balance and coordination.  MSK: Low back exam does have some tenderness to palpation on the left side of the paraspinal musculature.  Positive FABER  test noted.  Bell worsening pain with extension of the back.  After verbal consent patient was prepped with alcohol swab and with a 21-gauge 2 inch needle injected into the left sacroiliac joint with a total of 1 cc of 0.5% Marcaine and 1 cc of Kenalog 40 mg/mL.  Bell blood loss.  Band-Aid placed.  Postinjection instructions given.    Impression and Recommendations:     The above documentation has been reviewed and is accurate and complete Lynn Pulley, DO

## 2021-10-15 ENCOUNTER — Ambulatory Visit: Payer: 59 | Admitting: Family Medicine

## 2021-10-15 ENCOUNTER — Encounter: Payer: Self-pay | Admitting: Family Medicine

## 2021-10-15 ENCOUNTER — Other Ambulatory Visit: Payer: Self-pay

## 2021-10-15 DIAGNOSIS — M533 Sacrococcygeal disorders, not elsewhere classified: Secondary | ICD-10-CM

## 2021-10-15 DIAGNOSIS — M4306 Spondylolysis, lumbar region: Secondary | ICD-10-CM

## 2021-10-15 MED ORDER — DULOXETINE HCL 20 MG PO CPEP: 40.0000 mg | ORAL_CAPSULE | Freq: Every day | ORAL | 0 refills | Status: DC

## 2021-10-15 NOTE — Assessment & Plan Note (Signed)
Known pars defect.  Patient did respond well to the radiofrequency ablation for the back pain but continues to have low discomfort.  Patient's MRI did show that there was some mild left sacroiliac arthritis and did try an injection today we will see how patient responds.  Follow-up again in 4 weeks and at that point we will restart likely manipulation.  Also secondary to the chronic pain with mild exacerbation we will increase Cymbalta to 40 mg.

## 2021-10-15 NOTE — Assessment & Plan Note (Signed)
Patient given injection and tolerated the procedure well.  Discussed icing regimen and home exercises.  Patient hopefully will make some progress.  Could be candidate for PRP.  Follow-up again in 4 weeks and consider BRCA manipulation

## 2021-10-15 NOTE — Patient Instructions (Addendum)
Cymbalta 40mg  daily SI joint injection today See me in 4 weeks to restart manipulation

## 2021-11-15 ENCOUNTER — Other Ambulatory Visit: Payer: Self-pay | Admitting: Family Medicine

## 2021-11-18 NOTE — Progress Notes (Signed)
?Lynn Bell D.O. ?[facility] ?[place] ?Phone: [phone] ?Subjective:   ?I, Lynn Bell, am serving as a scribe for Dr. Hulan Saas. ? ?This visit occurred during the SARS-CoV-2 public health emergency.  Safety protocols were in place, including screening questions prior to the visit, additional usage of staff PPE, and extensive cleaning of exam room while observing appropriate contact time as indicated for disinfecting solutions.  ? ? ?I'm seeing this patient by the request  of:  Midge Minium, MD ? ?CC: Left SI joint and back pain follow-up ? ?[id number]  ?10/15/2021 ?Patient given injection and tolerated the procedure well.  Discussed icing regimen and home exercises.  Patient hopefully will make some progress.  Could be candidate for PRP.  Follow-up again in 4 weeks and consider BRCA manipulation ? ?Known pars defect.  Patient did respond well to the radiofrequency ablation for the back pain but continues to have low discomfort.  Patient's MRI did show that there was some mild left sacroiliac arthritis and did try an injection today we will see how patient responds.  Follow-up again in 4 weeks and at that point we will restart likely manipulation.  Also secondary to the chronic pain with mild exacerbation we will increase Cymbalta to 40 mg. ? ?Update 11/19/2021 ?Lynn Bell is a 36 y.o. female coming in with complaint of lumbar spine pain. Patient states that she is not doing much better since last visit. Only had one day of relief from injection. Trying chiro now as well.  Patient states that she continues to have pain.  Does feel better for a while after manipulation but unfortunately started having it again. ? ? ? ?  ? ?Past Medical History:  ?Diagnosis Date  ? Anxiety   ? Cardiac arrhythmia   ? Depression   ? PPD  ? History of chicken pox   ? Pregnancy induced hypertension   ? UTI (lower urinary tract infection)   ? ?Past Surgical History:   ?Procedure Laterality Date  ? CESAREAN SECTION N/A 06/01/2017  ? Procedure: CESAREAN SECTION;  Surgeon: Everlene Farrier, MD;  Location: [facility];  Service: Obstetrics;  Laterality: N/A;  ? CESAREAN SECTION N/A 01/07/2020  ? Procedure: CESAREAN SECTION;  Surgeon: Linda Hedges, DO;  Location: [facility];  Service: Obstetrics;  Laterality: N/A;  REPEAT ?[facility] 01/28/20 ?NKDA ?NEED RNFA  ? TONSILLECTOMY    ? ?Social History  ? ?Socioeconomic History  ? Marital status: Married  ?  Spouse name: Not on file  ? Number of children: Not on file  ? Years of education: Not on file  ? Highest education level: Not on file  ?Occupational History  ? Not on file  ?Tobacco Use  ? Smoking status: Former  ?  Packs/day: 0.25  ?  Types: Cigarettes  ? Smokeless tobacco: Never  ?Vaping Use  ? Vaping Use: Never used  ?Substance and Sexual Activity  ? Alcohol use: Yes  ?  Comment: weekly  ? Drug use: No  ? Sexual activity: Not on file  ?Other Topics Concern  ? Not on file  ?Social History Narrative  ? Not on file  ? ?Social Determinants of Health  ? ?Financial Resource Strain: Not on file  ?Food Insecurity: Not on file  ?Transportation Needs: Not on file  ?Physical Activity: Not on file  ?Stress: Not on file  ?Social Connections: Not on file  ? ?Allergies  ?Allergen Reactions  ? Latex   ?  Pt "prefers" no latex, but not an allergy ?Sensitive skin  ? ?Family History  ?Problem Relation Age of Onset  ? Breast cancer Maternal Grandmother   ?     possibly paternal also  ? Hyperlipidemia Father   ? Heart disease Father   ? Hypertension Father   ? Hyperlipidemia Mother   ? Heart disease Mother   ? Hypertension Mother   ? Pancreatic cancer Mother   ? Kidney disease Paternal Grandfather   ? Cancer Maternal Grandfather   ?     kidney removal due to mass  ? ? ? ? ? ?Current Outpatient Medications (Analgesics):  ?  ibuprofen (ADVIL) 800 MG tablet, Take 1 tablet (800 mg total) by mouth every 8 (eight) hours as needed. ?  meloxicam (MOBIC) 15 MG  tablet, TAKE 1 TABLET BY MOUTH EVERY DAY ? ? ?Current Outpatient Medications (Other):  ?  ALPRAZolam (XANAX) 0.25 MG tablet, Take 1 tablet (0.25 mg total) by mouth 2 (two) times daily as needed for anxiety. ?  buPROPion (WELLBUTRIN XL) 150 MG 24 hr tablet, TAKE 1 TABLET BY MOUTH EVERY DAY ?  busPIRone (BUSPAR) 5 MG tablet, Take 5 mg by mouth 2 (two) times daily. ?  clotrimazole-betamethasone (LOTRISONE) cream, Apply 1 application topically 2 (two) times daily. ?  DULoxetine (CYMBALTA) 20 MG capsule, Take 2 capsules (40 mg total) by mouth daily. ?  Fluocinolone Acetonide 0.01 % OIL, Place 5 drops in ear(s) 2 (two) times daily. Use for 7-14 days as needed ?  FLUoxetine (PROZAC) 10 MG capsule, TAKE 1 CAPSULE BY MOUTH EVERY DAY ? ? ?Reviewed prior external information including notes and imaging from  ?primary care provider ?As well as notes that were available from care everywhere and other healthcare systems. ? ?Past medical history, social, surgical and family history all reviewed in electronic medical record.  No pertanent information unless stated regarding to the chief complaint.  ? ?Review of Systems: ? No headache, visual changes, nausea, vomiting, diarrhea, constipation, dizziness, abdominal pain, skin rash, fevers, chills, night sweats, weight loss, swollen lymph nodes, body aches, joint swelling, chest pain, shortness of breath, mood changes. POSITIVE muscle aches ? ?Objective  ?Blood pressure 124/86, pulse 86, height '5\' 4"'  (1.626 m), weight 158 lb (71.7 kg), SpO2 99 %, not currently breastfeeding. ?  ?General: No apparent distress alert and oriented x3 mood and affect normal, dressed appropriately.  ?HEENT: Pupils equal, extraocular movements intact  ?Respiratory: Patient's speak in full sentences and does not appear short of breath  ?Cardiovascular: No lower extremity edema, non tender, no erythema  ?Gait normal with good balance and coordination.  ?MSK: Low back exam does have some pain with tenderness to  palpation in the paraspinal musculature.  Worsening pain with extension as well.  Patient does have a positive Corky Sox on the left side. ? ?Osteopathic findings ?C2 flexed rotated and side bent right ?C6 flexed rotated and side bent left ?T3 extended rotated and side bent right inhaled third rib ?T9 extended rotated and side bent left ?L2 flexed rotated and side bent left ?Sacrum left on left ? ? ?  ?Impression and Recommendations:  ?  ? ?The above documentation has been reviewed and is accurate and complete Lynn Pulley, DO ? ? ? ?

## 2021-11-19 ENCOUNTER — Ambulatory Visit: Payer: 59 | Admitting: Family Medicine

## 2021-11-19 ENCOUNTER — Other Ambulatory Visit: Payer: Self-pay

## 2021-11-19 VITALS — BP 124/86 | HR 86 | Ht 64.0 in | Wt 158.0 lb

## 2021-11-19 DIAGNOSIS — M9902 Segmental and somatic dysfunction of thoracic region: Secondary | ICD-10-CM

## 2021-11-19 DIAGNOSIS — M9903 Segmental and somatic dysfunction of lumbar region: Secondary | ICD-10-CM | POA: Diagnosis not present

## 2021-11-19 DIAGNOSIS — M533 Sacrococcygeal disorders, not elsewhere classified: Secondary | ICD-10-CM | POA: Diagnosis not present

## 2021-11-19 DIAGNOSIS — M9908 Segmental and somatic dysfunction of rib cage: Secondary | ICD-10-CM | POA: Diagnosis not present

## 2021-11-19 DIAGNOSIS — M9904 Segmental and somatic dysfunction of sacral region: Secondary | ICD-10-CM | POA: Diagnosis not present

## 2021-11-19 DIAGNOSIS — M9901 Segmental and somatic dysfunction of cervical region: Secondary | ICD-10-CM

## 2021-11-19 NOTE — Patient Instructions (Signed)
L SI joint still giving [place] problems ?Ok to see Ysidro Evert ?No change in meds ?See me in 4-6 weeks can consider CT ab/pelvis ?

## 2021-11-19 NOTE — Assessment & Plan Note (Signed)
No significant improvement long term with injection.  ?Still continue to attempt osteopathic manipulation.  Patient is taking the Cymbalta which is helped with the anxiety aspect.  But has not helped with the pain significantly at this time.  Discussed with patient about icing regimen and home exercises otherwise.  Does have a pars defect of the back it did respond initially to some injections in the back but still has not made any great success.  The other idea would be to send patient to discuss surgery.  The patient wants to hold on that.  Follow-up with me again in 4 to 8 weeks ?

## 2021-12-16 NOTE — Progress Notes (Signed)
?Lynn Bell D.O. ?[facility] ?[place] ?Phone: [phone] ?Subjective:   ?I, Lynn Bell, am serving as a scribe for Dr. Hulan Saas. ? ?This visit occurred during the SARS-CoV-2 public health emergency.  Safety protocols were in place, including screening questions prior to the visit, additional usage of staff PPE, and extensive cleaning of exam room while observing appropriate contact time as indicated for disinfecting solutions.  ?I'm seeing this patient by the request  of:  Midge Minium, MD ? ?CC: Low back pain follow-up ? ?[id number]  ?Lynn Bell is a 36 y.o. female coming in with complaint of back and neck pain. OMT on 11/19/2021.  Patient has known pars defect and did not have significant improvement with the injection previously.  Patient feels that Cymbalta had helped her anxiety but did not help her pain significantly.  Patient states that she is the same as last visit. Does not feel that Cymbalta has touched her pain but is helpful with anxiety. Injection in January did not help. Pain is constant and sometimes worse at certain times. Unsure of triggers.  ? ?Medications patient has been prescribed: Cymbalta ? ? ? ? ?  ? ? ? ? ?Reviewed prior external information including notes and imaging from previsou exam, outside providers and external EMR if available.  ? ?As well as notes that were available from care everywhere and other healthcare systems. ? ?Past medical history, social, surgical and family history all reviewed in electronic medical record.  No pertanent information unless stated regarding to the chief complaint.  ? ?Past Medical History:  ?Diagnosis Date  ? Anxiety   ? Cardiac arrhythmia   ? Depression   ? PPD  ? History of chicken pox   ? Pregnancy induced hypertension   ? UTI (lower urinary tract infection)   ?  ?Allergies  ?Allergen Reactions  ? Latex   ?  Pt "prefers" no latex, but not an allergy ?Sensitive skin  ? ? ? ?Review of  Systems: ? No headache, visual changes, nausea, vomiting, diarrhea, constipation, dizziness, abdominal pain, skin rash, fevers, chills, night sweats, weight loss, swollen lymph nodes,  joint swelling, chest pain, shortness of breath, mood changes. POSITIVE muscle aches, body aches  ? ?Objective  ?Blood pressure 110/76, pulse 86, height '5\' 4"'$  (1.626 m), weight 164 lb (74.4 kg), SpO2 99 %, not currently breastfeeding. ?  ?General: No apparent distress alert and oriented x3 mood and affect normal, dressed appropriately.  ?HEENT: Pupils equal, extraocular movements intact  ?Respiratory: Patient's speak in full sentences and does not appear short of breath  ?Cardiovascular: No lower extremity edema, non tender, no erythema  ?Back pain loss of lordosis  ?Positive FABER  ?Negative SLT  ?Worse with extension  ?Mild TTP diffusely of the stomach  ? ? ?Osteopathic findings ? ?C2 flexed rotated and side bent right ?C6 flexed rotated and side bent left ?T3 extended rotated and side bent right inhaled rib ?T8 extended rotated and side bent left ?L3 flexed rotated and side bent right ?Sacrum right on right ? ? ?  ?Assessment and Plan: ? ?Pars defect of lumbar spine ?Patient continues to have back pain that is out of proportion to the amount of pain patient is having at the moment.  Patient does have the pars defect.  Patient though did not respond to the injections long-term.  And responds only short-term also to the ?  ? ?Nonallopathic problems ? ?Decision today to treat with OMT was  based on Physical Exam ? ?After verbal consent patient was treated with HVLA, ME, FPR techniques in cervical, rib, thoracic, lumbar, and sacral  areas ? ?Patient tolerated the procedure well with improvement in symptoms ? ?Patient given exercises, stretches and lifestyle modifications ? ?See medications in patient instructions if given ? ?Patient will follow up in 4-8 weeks ? ?  ? ? ?The above documentation has been reviewed and is accurate and  complete [name], DO ? ? ? ?  ? ? Note: This dictation was prepared with Dragon dictation along with smaller phrase technology. Any transcriptional errors that result from this process are unintentional.    ?  ?  ? ?

## 2021-12-17 ENCOUNTER — Ambulatory Visit: Payer: 59 | Admitting: Family Medicine

## 2021-12-17 ENCOUNTER — Encounter: Payer: Self-pay | Admitting: Family Medicine

## 2021-12-17 VITALS — BP 110/76 | HR 86 | Ht 64.0 in | Wt 164.0 lb

## 2021-12-17 DIAGNOSIS — R102 Pelvic and perineal pain: Secondary | ICD-10-CM | POA: Diagnosis not present

## 2021-12-17 DIAGNOSIS — M9902 Segmental and somatic dysfunction of thoracic region: Secondary | ICD-10-CM

## 2021-12-17 DIAGNOSIS — M9901 Segmental and somatic dysfunction of cervical region: Secondary | ICD-10-CM | POA: Diagnosis not present

## 2021-12-17 DIAGNOSIS — M4306 Spondylolysis, lumbar region: Secondary | ICD-10-CM | POA: Diagnosis not present

## 2021-12-17 DIAGNOSIS — M9903 Segmental and somatic dysfunction of lumbar region: Secondary | ICD-10-CM | POA: Diagnosis not present

## 2021-12-17 DIAGNOSIS — M9908 Segmental and somatic dysfunction of rib cage: Secondary | ICD-10-CM | POA: Diagnosis not present

## 2021-12-17 DIAGNOSIS — M9904 Segmental and somatic dysfunction of sacral region: Secondary | ICD-10-CM

## 2021-12-17 NOTE — Patient Instructions (Signed)
Good to see you ?CT Ab/pelvis w contrast ?See me again in 7-8 weeks ?

## 2021-12-17 NOTE — Assessment & Plan Note (Signed)
Patient continues to have back pain that is out of proportion to the amount of pain patient is having at the moment.  Patient does have the pars defect.  Patient though did not respond to the injections long-term.  And responds only short-term also to the ?

## 2022-01-02 ENCOUNTER — Ambulatory Visit: Payer: 59 | Admitting: Family Medicine

## 2022-01-12 ENCOUNTER — Other Ambulatory Visit: Payer: 59

## 2022-01-12 ENCOUNTER — Ambulatory Visit (INDEPENDENT_AMBULATORY_CARE_PROVIDER_SITE_OTHER)
Admission: RE | Admit: 2022-01-12 | Discharge: 2022-01-12 | Disposition: A | Discharge status: Home / Self Care | Payer: 59 | Source: Ambulatory Visit | Attending: Family Medicine | Admitting: Family Medicine

## 2022-01-12 DIAGNOSIS — R102 Pelvic and perineal pain: Secondary | ICD-10-CM

## 2022-01-12 MED ORDER — IOHEXOL 300 MG/ML  SOLN
100.0000 mL | Freq: Once | INTRAMUSCULAR | Status: AC | PRN
  Administered 2022-01-12: 100 mL via INTRAVENOUS

## 2022-01-14 ENCOUNTER — Ambulatory Visit (INDEPENDENT_AMBULATORY_CARE_PROVIDER_SITE_OTHER): Payer: 59 | Admitting: Family Medicine

## 2022-01-14 ENCOUNTER — Encounter: Payer: Self-pay | Admitting: Family Medicine

## 2022-01-14 VITALS — BP 118/70 | HR 94 | Temp 98.9°F | Resp 16 | Ht 64.0 in | Wt 162.2 lb

## 2022-01-14 DIAGNOSIS — M5416 Radiculopathy, lumbar region: Secondary | ICD-10-CM

## 2022-01-14 DIAGNOSIS — F419 Anxiety disorder, unspecified: Secondary | ICD-10-CM | POA: Diagnosis not present

## 2022-01-14 DIAGNOSIS — R69 Illness, unspecified: Secondary | ICD-10-CM | POA: Diagnosis not present

## 2022-01-14 MED ORDER — BUSPIRONE HCL 5 MG PO TABS: 5.0000 mg | ORAL_TABLET | Freq: Two times a day (BID) | ORAL | 1 refills | Status: DC

## 2022-01-14 MED ORDER — ALPRAZOLAM 0.25 MG PO TABS: 0.2500 mg | ORAL_TABLET | Freq: Two times a day (BID) | ORAL | 1 refills | Status: DC | PRN

## 2022-01-14 MED ORDER — BUPROPION HCL ER (XL) 150 MG PO TB24: 150.0000 mg | ORAL_TABLET | Freq: Every day | ORAL | 1 refills | Status: DC

## 2022-01-14 NOTE — Progress Notes (Signed)
? ?  Subjective:  ? ? Patient ID: Lynn Bell, female    DOB: 1985/09/22, 36 y.o.   MRN: [id number] ? ?HPI ?Chronic back pain- pt has been seeing Dr Tamala Julian.  Just recently had CT scan.  Has had injxns, nerve ablation, medication.  Difficulty sitting and standing.  Unable to sleep comfortably.  Has done PT.  Pain is constant.  Interfering w/ all aspects of life.  Pt is exhausted.  Sxs started ~1 yr ago.  MRI 8/25 showed L4-5 disc bulge w/ disc protrusion possibly impacting L4 nerve root.  Also showed L5-S1 disc desiccation w/ central disc protrusion indenting ventral thecal sac, possibly contacting both S1 nerve roots. ? ?Anxiety- worsening.  Husband has changed jobs and is now working nights.  Son is still struggling to deal w/ GMA's death and is acting out at school and at home.  Stopped Prozac when Sports Med started Cymbalta.  Did not like how Cymbalta felt.  Currently on Wellbutrin '150mg'$  daily. ? ?Review of Systems ?For ROS see HPI  ?   ?Objective:  ? Physical Exam ?Vitals reviewed.  ?Constitutional:   ?   General: She is not in acute distress. ?   Appearance: Normal appearance. She is not ill-appearing.  ?HENT:  ?   Head: Normocephalic and atraumatic.  ?Cardiovascular:  ?   Rate and Rhythm: Normal rate and regular rhythm.  ?Pulmonary:  ?   Effort: Pulmonary effort is normal. No respiratory distress.  ?Skin: ?   General: Skin is warm and dry.  ?Neurological:  ?   Mental Status: She is alert and oriented to person, place, and time.  ?Psychiatric:  ?   Comments: Tearful, anxious  ? ? ? ? ? ?   ?Assessment & Plan:  ? ? ?

## 2022-01-14 NOTE — Patient Instructions (Signed)
Schedule your complete physical in 3 months ?We'll call you with your Neurosurg appt ?Even on the worst days, you are still doing your best!  And that's what matters! ?Call with any questions or concerns ?Hang in there!!! ?

## 2022-01-15 DIAGNOSIS — F4321 Adjustment disorder with depressed mood: Secondary | ICD-10-CM | POA: Diagnosis not present

## 2022-01-15 DIAGNOSIS — R69 Illness, unspecified: Secondary | ICD-10-CM | POA: Diagnosis not present

## 2022-01-15 DIAGNOSIS — F331 Major depressive disorder, recurrent, moderate: Secondary | ICD-10-CM | POA: Diagnosis not present

## 2022-01-18 DIAGNOSIS — M5416 Radiculopathy, lumbar region: Secondary | ICD-10-CM | POA: Insufficient documentation

## 2022-01-18 NOTE — Assessment & Plan Note (Signed)
Ongoing issue for pt.  Has been seeing Sports Med for over a year and is not getting relief.  She is in constant pain.  Unable to sit, stand, or lie comfortably.  Has done PT w/o improvement.  Has had injxns, ablations, and medications.  Given the results of MRI done on 05/08/21 she is in need of neurosurg referral to address the bulging discs impacting the nerve roots.  Referral placed. ?

## 2022-01-18 NOTE — Assessment & Plan Note (Signed)
Deteriorated.  Pt is still having a hard time w/ mom's death and now husband has changed jobs- switching to nightshift- and son is now acting out both at school and at home.  Currently on Wellbutrin '150mg'$  daily.  Open to idea of adding medication- will add Buspar.  Refill on Alprazolam provided. ?

## 2022-01-19 DIAGNOSIS — F331 Major depressive disorder, recurrent, moderate: Secondary | ICD-10-CM | POA: Diagnosis not present

## 2022-01-19 DIAGNOSIS — R69 Illness, unspecified: Secondary | ICD-10-CM | POA: Diagnosis not present

## 2022-01-19 DIAGNOSIS — F4321 Adjustment disorder with depressed mood: Secondary | ICD-10-CM | POA: Diagnosis not present

## 2022-01-30 ENCOUNTER — Ambulatory Visit (INDEPENDENT_AMBULATORY_CARE_PROVIDER_SITE_OTHER): Payer: 59 | Admitting: Family Medicine

## 2022-01-30 ENCOUNTER — Encounter: Payer: Self-pay | Admitting: Family Medicine

## 2022-01-30 VITALS — BP 118/80 | HR 84 | Temp 98.0°F | Resp 16 | Ht 64.0 in | Wt 161.4 lb

## 2022-01-30 DIAGNOSIS — H66001 Acute suppurative otitis media without spontaneous rupture of ear drum, right ear: Secondary | ICD-10-CM | POA: Diagnosis not present

## 2022-01-30 MED ORDER — AMOXICILLIN 875 MG PO TABS: 875.0000 mg | ORAL_TABLET | Freq: Two times a day (BID) | ORAL | 0 refills | Status: AC

## 2022-01-30 NOTE — Progress Notes (Signed)
   Subjective:    Patient ID: Lynn Bell, female    DOB: 10-10-1985, 36 y.o.   MRN: [id number]  HPI R ear pain- sxs started Tuesday w/ intense pain in R ear.  Ear is draining.  No fever.  Has some pain w/ swallowing due to ear popping.  Daughter w/ double ear infxn.  Taking tylenol for pain   Review of Systems For ROS see HPI     Objective:   Physical Exam Vitals reviewed.  Constitutional:      General: She is not in acute distress.    Appearance: Normal appearance. She is not ill-appearing.  HENT:     Head: Normocephalic and atraumatic.     Right Ear: Decreased hearing noted. Drainage present. A middle ear effusion is present. Tympanic membrane is injected, scarred and bulging.     Left Ear: Ear canal normal. Tympanic membrane is scarred.  Musculoskeletal:     Cervical back: Neck supple.  Lymphadenopathy:     Cervical: No cervical adenopathy.  Skin:    General: Skin is warm and dry.  Neurological:     General: No focal deficit present.     Mental Status: She is alert and oriented to person, place, and time.  Psychiatric:        Mood and Affect: Mood normal.        Behavior: Behavior normal.          Assessment & Plan:   R OM- new.  Pt w/ obvious otitis media on R.  Purulent fluid present behind bulging ear drum.  Start Amoxicillin.  Reviewed supportive care and red flags that should prompt return.  Pt expressed understanding and is in agreement w/ plan.

## 2022-01-30 NOTE — Patient Instructions (Signed)
Follow up as scheduled or as needed START the Amoxicillin twice daily- take w/ food Drink LOTS of fluids Tylenol/ibuprofen as needed for pain Call with any questions or concerns Hang in there!!

## 2022-02-05 DIAGNOSIS — M47816 Spondylosis without myelopathy or radiculopathy, lumbar region: Secondary | ICD-10-CM | POA: Diagnosis not present

## 2022-02-05 DIAGNOSIS — Z6827 Body mass index (BMI) 27.0-27.9, adult: Secondary | ICD-10-CM | POA: Diagnosis not present

## 2022-02-05 DIAGNOSIS — M461 Sacroiliitis, not elsewhere classified: Secondary | ICD-10-CM | POA: Diagnosis not present

## 2022-02-11 ENCOUNTER — Ambulatory Visit: Payer: 59 | Admitting: Family Medicine

## 2022-02-11 ENCOUNTER — Telehealth: Payer: Self-pay | Admitting: Family Medicine

## 2022-02-11 NOTE — Telephone Encounter (Signed)
PT stating that she finish her antibiotic for ear and her ear still feels the same.Pt has appt for tomorrow at 8:40 am.

## 2022-02-12 ENCOUNTER — Other Ambulatory Visit: Payer: Self-pay

## 2022-02-12 ENCOUNTER — Encounter: Payer: Self-pay | Admitting: Family Medicine

## 2022-02-12 ENCOUNTER — Ambulatory Visit (INDEPENDENT_AMBULATORY_CARE_PROVIDER_SITE_OTHER): Payer: 59 | Admitting: Family Medicine

## 2022-02-12 VITALS — BP 106/60 | HR 80 | Temp 97.6°F | Resp 17 | Ht 64.0 in | Wt 159.0 lb

## 2022-02-12 DIAGNOSIS — H6981 Other specified disorders of Eustachian tube, right ear: Secondary | ICD-10-CM | POA: Diagnosis not present

## 2022-02-12 MED ORDER — MOMETASONE FUROATE 50 MCG/ACT NA SUSP: 2.0000 | Freq: Every day | NASAL | 12 refills | Status: AC

## 2022-02-12 NOTE — Progress Notes (Signed)
   Subjective:    Patient ID: Lynn Bell, female    DOB: Oct 27, 1985, 36 y.o.   MRN: [id number]  HPI R ear fullness- pt was tx'd for a R OM on 5/19.  Pt reports pain has improved but ear continues to pop and feel full.  Particularly when swallowing.  No drainage.  Not currently on allergy medication.  No sxs in L ear.  No fevers.   Review of Systems For ROS see HPI     Objective:   Physical Exam Vitals reviewed.  Constitutional:      General: She is not in acute distress.    Appearance: Normal appearance. She is not ill-appearing.  HENT:     Head: Normocephalic and atraumatic.     Left Ear: Tympanic membrane and ear canal normal.     Ears:     Comments: R TM markedly retracted    Nose: Congestion present. No rhinorrhea.     Mouth/Throat:     Mouth: Mucous membranes are moist.     Pharynx: No oropharyngeal exudate or posterior oropharyngeal erythema.  Eyes:     Extraocular Movements: Extraocular movements intact.     Conjunctiva/sclera: Conjunctivae normal.  Musculoskeletal:     Cervical back: Neck supple.  Lymphadenopathy:     Cervical: No cervical adenopathy.  Skin:    General: Skin is warm and dry.  Neurological:     General: No focal deficit present.     Mental Status: She is alert and oriented to person, place, and time.  Psychiatric:        Mood and Affect: Mood normal.        Behavior: Behavior normal.        Thought Content: Thought content normal.          Assessment & Plan:  Eustachian tube dysfxn- R sided.  Will start daily antihistamine and add nasal steroid.  Reviewed dx and tx plan w/ pt.  Pt expressed understanding and is in agreement w/ plan.

## 2022-02-12 NOTE — Patient Instructions (Signed)
Follow up as needed or as scheduled START daily Claritin or Zyrtec to help open the ear START the Nasonex- 2 sprays each nostril- until ear is better Continue to drink LOTS of water!!! Call with any questions or concerns Hang in there!!!

## 2022-03-02 DIAGNOSIS — M461 Sacroiliitis, not elsewhere classified: Secondary | ICD-10-CM | POA: Diagnosis not present

## 2022-03-05 NOTE — Progress Notes (Deleted)
  [facility] [place] Phone: [phone] Subjective:    I'm seeing this patient by the request  of:  Midge Minium, MD  CC:   [id number]  Lynn Bell is a 36 y.o. female coming in with complaint of back and neck pain. OMT on 12/17/2021. Patient states   Medications patient has been prescribed: None  Taking:         Reviewed prior external information including notes and imaging from previsou exam, outside providers and external EMR if available.   As well as notes that were available from care everywhere and other healthcare systems.  Past medical history, social, surgical and family history all reviewed in electronic medical record.  No pertanent information unless stated regarding to the chief complaint.   Past Medical History:  Diagnosis Date   Anxiety    Cardiac arrhythmia    Depression    PPD   History of chicken pox    Pregnancy induced hypertension    UTI (lower urinary tract infection)     Allergies  Allergen Reactions   Latex     Pt "prefers" no latex, but not an allergy Sensitive skin     Review of Systems:  No headache, visual changes, nausea, vomiting, diarrhea, constipation, dizziness, abdominal pain, skin rash, fevers, chills, night sweats, weight loss, swollen lymph nodes, body aches, joint swelling, chest pain, shortness of breath, mood changes. POSITIVE muscle aches  Objective  unknown if currently breastfeeding.   General: No apparent distress alert and oriented x3 mood and affect normal, dressed appropriately.  HEENT: Pupils equal, extraocular movements intact  Respiratory: Patient's speak in full sentences and does not appear short of breath  Cardiovascular: No lower extremity edema, non tender, no erythema  Gait MSK:  Back   Osteopathic findings  C2 flexed rotated and side bent right C6 flexed rotated and side bent left T3 extended rotated and side bent right  inhaled rib T9 extended rotated and side bent left L2 flexed rotated and side bent right Sacrum right on right       Assessment and Plan:  No problem-specific Assessment & Plan notes found for this encounter.    Nonallopathic problems  Decision today to treat with OMT was based on Physical Exam  After verbal consent patient was treated with HVLA, ME, FPR techniques in cervical, rib, thoracic, lumbar, and sacral  areas  Patient tolerated the procedure well with improvement in symptoms  Patient given exercises, stretches and lifestyle modifications  See medications in patient instructions if given  Patient will follow up in 4-8 weeks             Note: This dictation was prepared with Dragon dictation along with smaller phrase technology. Any transcriptional errors that result from this process are unintentional.

## 2022-03-11 ENCOUNTER — Ambulatory Visit: Payer: 59 | Admitting: Family Medicine

## 2022-04-22 ENCOUNTER — Encounter: Payer: 59 | Admitting: Family Medicine

## 2022-05-05 ENCOUNTER — Encounter: Payer: Self-pay | Admitting: Family Medicine

## 2022-05-05 ENCOUNTER — Ambulatory Visit (INDEPENDENT_AMBULATORY_CARE_PROVIDER_SITE_OTHER): Payer: 59 | Admitting: Family Medicine

## 2022-05-05 VITALS — BP 120/78 | HR 71 | Temp 97.8°F | Resp 16 | Ht 66.0 in | Wt 162.0 lb

## 2022-05-05 DIAGNOSIS — E663 Overweight: Secondary | ICD-10-CM | POA: Diagnosis not present

## 2022-05-05 DIAGNOSIS — R1084 Generalized abdominal pain: Secondary | ICD-10-CM | POA: Diagnosis not present

## 2022-05-05 DIAGNOSIS — Z0000 Encounter for general adult medical examination without abnormal findings: Secondary | ICD-10-CM

## 2022-05-05 LAB — BASIC METABOLIC PANEL
BUN: 13 mg/dL (ref 6–23)
CO2: 25 mEq/L (ref 19–32)
Calcium: 9.4 mg/dL (ref 8.4–10.5)
Chloride: 102 mEq/L (ref 96–112)
Creatinine, Ser: 0.69 mg/dL (ref 0.40–1.20)
GFR: 111.21 mL/min (ref >60.00)
Glucose, Bld: 91 mg/dL (ref 70–99)
Potassium: 4.5 mEq/L (ref 3.5–5.1)
Sodium: 136 mEq/L (ref 135–145)

## 2022-05-05 LAB — CBC WITH DIFFERENTIAL/PLATELET
Basophils Absolute: 0.1 10*3/uL (ref 0.0–0.1)
Basophils Relative: 1.2 % (ref 0.0–3.0)
Eosinophils Absolute: 0.2 10*3/uL (ref 0.0–0.7)
Eosinophils Relative: 3 % (ref 0.0–5.0)
HCT: 39.6 % (ref 36.0–46.0)
Hemoglobin: 13.3 g/dL (ref 12.0–15.0)
Lymphocytes Relative: 28.2 % (ref 12.0–46.0)
Lymphs Abs: 1.7 10*3/uL (ref 0.7–4.0)
MCHC: 33.5 g/dL (ref 30.0–36.0)
MCV: 90.5 fl (ref 78.0–100.0)
Monocytes Absolute: 0.6 10*3/uL (ref 0.1–1.0)
Monocytes Relative: 9.8 % (ref 3.0–12.0)
Neutro Abs: 3.5 10*3/uL (ref 1.4–7.7)
Neutrophils Relative %: 57.8 % (ref 43.0–77.0)
Platelets: 299 10*3/uL (ref 150.0–400.0)
RBC: 4.38 Mil/uL (ref 3.87–5.11)
RDW: 13.8 % (ref 11.5–15.5)
WBC: 6.1 10*3/uL (ref 4.0–10.5)

## 2022-05-05 LAB — LIPID PANEL
Cholesterol: 208 mg/dL — ABNORMAL HIGH (ref 0–200)
HDL: 95.5 mg/dL (ref >39.00)
LDL Cholesterol: 84 mg/dL (ref 0–99)
NonHDL: 112.58
Total CHOL/HDL Ratio: 2
Triglycerides: 144 mg/dL (ref 0.0–149.0)
VLDL: 28.8 mg/dL (ref 0.0–40.0)

## 2022-05-05 LAB — HEPATIC FUNCTION PANEL
ALT: 12 U/L (ref 0–35)
AST: 16 U/L (ref 0–37)
Albumin: 4.5 g/dL (ref 3.5–5.2)
Alkaline Phosphatase: 47 U/L (ref 39–117)
Bilirubin, Direct: 0 mg/dL (ref 0.0–0.3)
Total Bilirubin: 0.3 mg/dL (ref 0.2–1.2)
Total Protein: 7.4 g/dL (ref 6.0–8.3)

## 2022-05-05 LAB — VITAMIN D 25 HYDROXY (VIT D DEFICIENCY, FRACTURES): VITD: 38.15 ng/mL (ref 30.00–100.00)

## 2022-05-05 LAB — TSH: TSH: 2 u[IU]/mL (ref 0.35–5.50)

## 2022-05-05 NOTE — Patient Instructions (Signed)
Follow up in 1 year or as needed We'll notify you of your lab results and make any changes if needed Keep up the good work on healthy diet and regular exercise- you look great!!! We'll call you with your GI appt Call with any questions or concerns [place]!!! Hang in there!!!

## 2022-05-05 NOTE — Progress Notes (Unsigned)
   Subjective:    Patient ID: Lynn Bell, female    DOB: 1986-03-24, 36 y.o.   MRN: [id number]  HPI CPE- UTD on pap, Tdap  Patient Care Team    Relationship Specialty Notifications Start End  Midge Minium, MD PCP - General Family Medicine  07/06/12   Aloha Gell, MD Consulting Physician Obstetrics and Gynecology  08/30/15     Health Maintenance  Topic Date Due   INFLUENZA VACCINE  12/13/2022 (Originally 04/14/2022)   PAP SMEAR-Modifier  02/19/2023   TETANUS/TDAP  08/31/2026   HIV Screening  Completed   HPV VACCINES  Aged Out   COVID-19 Vaccine  Discontinued   Hepatitis C Screening  Discontinued      Review of Systems Patient reports no vision/ hearing changes, adenopathy,fever, weight change,  persistant/recurrent hoarseness , swallowing issues, chest pain, palpitations, edema, persistant/recurrent cough, hemoptysis, dyspnea (rest/exertional/paroxysmal nocturnal), gastrointestinal bleeding (melena, rectal bleeding), significant heartburn, bowel changes, GU symptoms (dysuria, hematuria, incontinence), Gyn symptoms (abnormal  bleeding, pain),  syncope, focal weakness, memory loss, numbness & tingling, skin/hair/nail changes, abnormal bruising or bleeding, anxiety, or depression.   + episodic abd pain    Objective:   Physical Exam General Appearance:    Alert, cooperative, no distress, appears stated age  Head:    Normocephalic, without obvious abnormality, atraumatic  Eyes:    PERRL, conjunctiva/corneas clear, EOM's intact both eyes  Ears:    Normal TM's and external ear canals, both ears  Nose:   Nares normal, septum midline, mucosa normal, no drainage    or sinus tenderness  Throat:   Lips, mucosa, and tongue normal; teeth and gums normal  Neck:   Supple, symmetrical, trachea midline, no adenopathy;    Thyroid: no enlargement/tenderness/nodules  Back:     Symmetric, no curvature, ROM normal, no CVA tenderness  Lungs:     Clear to auscultation bilaterally,  respirations unlabored  Chest Wall:    No tenderness or deformity   Heart:    Regular rate and rhythm, S1 and S2 normal, no murmur, rub   or gallop  Breast Exam:    Deferred to GYN  Abdomen:     Soft, non-tender, bowel sounds active all four quadrants,    no masses, no organomegaly  Genitalia:    Deferred to GYN  Rectal:    Extremities:   Extremities normal, atraumatic, no cyanosis or edema  Pulses:   2+ and symmetric all extremities  Skin:   Skin color, texture, turgor normal, no rashes or lesions  Lymph nodes:   Cervical, supraclavicular, and axillary nodes normal  Neurologic:   CNII-XII intact, normal strength, sensation and reflexes    throughout          Assessment & Plan:

## 2022-05-06 ENCOUNTER — Telehealth: Payer: Self-pay

## 2022-05-06 NOTE — Telephone Encounter (Signed)
-----   Message from Midge Minium, MD sent at 05/06/2022  7:29 AM EDT ----- Labs look great!  No changes at this time

## 2022-05-06 NOTE — Assessment & Plan Note (Signed)
Pt's PE WNL.  UTD on pap, Tdap.  Check labs.  Anticipatory guidance provided.

## 2022-07-29 ENCOUNTER — Telehealth: Payer: Self-pay | Admitting: Family Medicine

## 2022-07-29 NOTE — Telephone Encounter (Signed)
Will determine whether ENT is needed based on exam tomorrow

## 2022-07-29 NOTE — Telephone Encounter (Signed)
Patient would like referral to ENT for recurrent ear infection

## 2022-07-29 NOTE — Telephone Encounter (Signed)
Caller name: Marni Franzoni  On DPR?: Yes  Call back number: [phone] (mobile)  Provider they see: Midge Minium, MD  Reason for call:( FYI) PT called stating that she has a continuous ear infection.  Pt states this the 3rd ear infection she has had in the past month or two. Pt went to urgent care, they prescribed her amoxicillin. Pt finished the amoxicillin and the ear infection came right back. Pt wants to know if she needs to go see a ENT doc. Pt is scheduled for tomorrow

## 2022-07-30 ENCOUNTER — Encounter: Payer: Self-pay | Admitting: Family Medicine

## 2022-07-30 ENCOUNTER — Ambulatory Visit (INDEPENDENT_AMBULATORY_CARE_PROVIDER_SITE_OTHER): Payer: 59 | Admitting: Family Medicine

## 2022-07-30 VITALS — BP 116/80 | HR 91 | Temp 98.4°F | Resp 16 | Ht 66.0 in | Wt 167.0 lb

## 2022-07-30 DIAGNOSIS — H66006 Acute suppurative otitis media without spontaneous rupture of ear drum, recurrent, bilateral: Secondary | ICD-10-CM | POA: Diagnosis not present

## 2022-07-30 MED ORDER — AZITHROMYCIN 250 MG PO TABS: ORAL_TABLET | ORAL | 0 refills | Status: DC

## 2022-07-30 NOTE — Patient Instructions (Addendum)
Follow up as needed or as scheduled We'll call you to schedule your ENT appt START the Zpack as directed Continue Zyrtec daily to help w/ fluid in the ears and sinus pressure Call with any questions or concerns Stay Safe!  Stay Healthy! Hang in there!!

## 2022-07-30 NOTE — Progress Notes (Signed)
   Subjective:    Patient ID: Lynn Bell, female    DOB: 01/07/1986, 36 y.o.   MRN: [id number]  HPI Ear infection- pt reports recurrent infections since August.  Was seen ~4 weeks ago at [facility] and tx'd w/ Amoxicillin.  Does not feel Amox improved sxs.  Continues to have issues w/ dry ears and then they will ooze.  No fever but very painful.     Review of Systems For ROS see HPI     Objective:   Physical Exam Vitals reviewed.  Constitutional:      General: She is not in acute distress.    Appearance: Normal appearance. She is not ill-appearing.  HENT:     Head: Normocephalic and atraumatic.     Right Ear: Tympanic membrane, ear canal and external ear normal.     Left Ear: Tenderness present. A middle ear effusion is present. Tympanic membrane is scarred and erythematous.     Nose: No congestion.     Mouth/Throat:     Mouth: Mucous membranes are moist.     Pharynx: No oropharyngeal exudate or posterior oropharyngeal erythema.  Eyes:     Extraocular Movements: Extraocular movements intact.     Conjunctiva/sclera: Conjunctivae normal.     Pupils: Pupils are equal, round, and reactive to light.  Cardiovascular:     Rate and Rhythm: Normal rate and regular rhythm.     Heart sounds: Normal heart sounds.  Pulmonary:     Effort: Pulmonary effort is normal. No respiratory distress.     Breath sounds: Normal breath sounds. No wheezing.  Musculoskeletal:     Cervical back: Normal range of motion.  Lymphadenopathy:     Cervical: No cervical adenopathy.  Skin:    General: Skin is warm and dry.  Neurological:     General: No focal deficit present.     Mental Status: She is alert and oriented to person, place, and time.  Psychiatric:        Mood and Affect: Mood normal.        Behavior: Behavior normal.        Thought Content: Thought content normal.           Assessment & Plan:  Recurrent OM- new.  Pt continues to have problems w/ ears, L>R.  She recently completed course of  Amox so will start [place] while waiting on ENT referral.  Pt expressed understanding and is in agreement w/ plan.

## 2022-08-14 ENCOUNTER — Other Ambulatory Visit: Payer: Self-pay | Admitting: Family Medicine

## 2022-11-23 DIAGNOSIS — L299 Pruritus, unspecified: Secondary | ICD-10-CM | POA: Insufficient documentation

## 2022-11-23 DIAGNOSIS — Z8709 Personal history of other diseases of the respiratory system: Secondary | ICD-10-CM | POA: Insufficient documentation

## 2022-12-16 ENCOUNTER — Other Ambulatory Visit: Payer: Self-pay | Admitting: Family Medicine

## 2023-01-28 DIAGNOSIS — R590 Localized enlarged lymph nodes: Secondary | ICD-10-CM | POA: Insufficient documentation

## 2023-02-25 ENCOUNTER — Other Ambulatory Visit: Payer: Self-pay | Admitting: Family Medicine

## 2023-03-17 ENCOUNTER — Other Ambulatory Visit: Payer: Self-pay | Admitting: Obstetrics & Gynecology

## 2023-03-17 DIAGNOSIS — N644 Mastodynia: Secondary | ICD-10-CM

## 2023-03-17 LAB — HM PAP SMEAR: HPV, high-risk: NEGATIVE

## 2023-03-24 ENCOUNTER — Other Ambulatory Visit: Payer: Self-pay | Admitting: Obstetrics & Gynecology

## 2023-03-24 ENCOUNTER — Ambulatory Visit
Admission: RE | Admit: 2023-03-24 | Discharge: 2023-03-24 | Disposition: A | Discharge status: Home / Self Care | Payer: Managed Care, Other (non HMO) | Source: Ambulatory Visit | Attending: Obstetrics & Gynecology | Admitting: Obstetrics & Gynecology

## 2023-03-24 ENCOUNTER — Ambulatory Visit: Payer: 59

## 2023-03-24 ENCOUNTER — Ambulatory Visit
Admission: RE | Admit: 2023-03-24 | Discharge: 2023-03-24 | Disposition: A | Discharge status: Home / Self Care | Payer: 59 | Source: Ambulatory Visit | Attending: Obstetrics & Gynecology | Admitting: Obstetrics & Gynecology

## 2023-03-24 DIAGNOSIS — N644 Mastodynia: Secondary | ICD-10-CM

## 2023-07-11 ENCOUNTER — Other Ambulatory Visit: Payer: Self-pay | Admitting: Family Medicine

## 2023-07-12 NOTE — Telephone Encounter (Signed)
Last office visit 07/30/2022 Please advise

## 2023-09-05 ENCOUNTER — Other Ambulatory Visit: Payer: Self-pay | Admitting: Family Medicine

## 2023-11-05 ENCOUNTER — Other Ambulatory Visit: Payer: Self-pay | Admitting: Family Medicine

## 2024-01-07 ENCOUNTER — Other Ambulatory Visit: Payer: Self-pay | Admitting: Family Medicine

## 2024-02-16 ENCOUNTER — Encounter: Payer: Self-pay | Admitting: Family Medicine

## 2024-02-16 ENCOUNTER — Ambulatory Visit: Payer: Self-pay | Admitting: Family Medicine

## 2024-02-16 ENCOUNTER — Ambulatory Visit: Admitting: Family Medicine

## 2024-02-16 VITALS — BP 128/78 | HR 104 | Temp 97.9°F | Ht 65.5 in | Wt 168.2 lb

## 2024-02-16 DIAGNOSIS — F419 Anxiety disorder, unspecified: Secondary | ICD-10-CM | POA: Diagnosis not present

## 2024-02-16 DIAGNOSIS — E663 Overweight: Secondary | ICD-10-CM | POA: Diagnosis not present

## 2024-02-16 DIAGNOSIS — Z0001 Encounter for general adult medical examination with abnormal findings: Secondary | ICD-10-CM

## 2024-02-16 DIAGNOSIS — R221 Localized swelling, mass and lump, neck: Secondary | ICD-10-CM

## 2024-02-16 DIAGNOSIS — Z1159 Encounter for screening for other viral diseases: Secondary | ICD-10-CM | POA: Diagnosis not present

## 2024-02-16 DIAGNOSIS — Z0000 Encounter for general adult medical examination without abnormal findings: Secondary | ICD-10-CM

## 2024-02-16 LAB — BASIC METABOLIC PANEL WITH GFR
BUN: 18 mg/dL (ref 6–23)
CO2: 26 meq/L (ref 19–32)
Calcium: 9.1 mg/dL (ref 8.4–10.5)
Chloride: 105 meq/L (ref 96–112)
Creatinine, Ser: 0.82 mg/dL (ref 0.40–1.20)
GFR: 90.52 mL/min (ref >60.00)
Glucose, Bld: 85 mg/dL (ref 70–99)
Potassium: 4.4 meq/L (ref 3.5–5.1)
Sodium: 136 meq/L (ref 135–145)

## 2024-02-16 LAB — CBC WITH DIFFERENTIAL/PLATELET
Basophils Absolute: 0 10*3/uL (ref 0.0–0.1)
Basophils Relative: 0.5 % (ref 0.0–3.0)
Eosinophils Absolute: 0.2 10*3/uL (ref 0.0–0.7)
Eosinophils Relative: 3.4 % (ref 0.0–5.0)
HCT: 38.5 % (ref 36.0–46.0)
Hemoglobin: 12.8 g/dL (ref 12.0–15.0)
Lymphocytes Relative: 25.6 % (ref 12.0–46.0)
Lymphs Abs: 1.7 10*3/uL (ref 0.7–4.0)
MCHC: 33.3 g/dL (ref 30.0–36.0)
MCV: 90.7 fl (ref 78.0–100.0)
Monocytes Absolute: 0.6 10*3/uL (ref 0.1–1.0)
Monocytes Relative: 8.8 % (ref 3.0–12.0)
Neutro Abs: 4.1 10*3/uL (ref 1.4–7.7)
Neutrophils Relative %: 61.7 % (ref 43.0–77.0)
Platelets: 293 10*3/uL (ref 150.0–400.0)
RBC: 4.25 Mil/uL (ref 3.87–5.11)
RDW: 13.5 % (ref 11.5–15.5)
WBC: 6.6 10*3/uL (ref 4.0–10.5)

## 2024-02-16 LAB — HEPATIC FUNCTION PANEL
ALT: 13 U/L (ref 0–35)
AST: 17 U/L (ref 0–37)
Albumin: 4.5 g/dL (ref 3.5–5.2)
Alkaline Phosphatase: 44 U/L (ref 39–117)
Bilirubin, Direct: 0.1 mg/dL (ref 0.0–0.3)
Total Bilirubin: 0.4 mg/dL (ref 0.2–1.2)
Total Protein: 7.2 g/dL (ref 6.0–8.3)

## 2024-02-16 LAB — VITAMIN D 25 HYDROXY (VIT D DEFICIENCY, FRACTURES): VITD: 29.29 ng/mL — ABNORMAL LOW (ref 30.00–100.00)

## 2024-02-16 LAB — TSH: TSH: 1.6 u[IU]/mL (ref 0.35–5.50)

## 2024-02-16 LAB — LIPID PANEL
Cholesterol: 199 mg/dL (ref 0–200)
HDL: 85.3 mg/dL (ref >39.00)
LDL Cholesterol: 81 mg/dL (ref 0–99)
NonHDL: 113.31
Total CHOL/HDL Ratio: 2
Triglycerides: 163 mg/dL — ABNORMAL HIGH (ref 0.0–149.0)
VLDL: 32.6 mg/dL (ref 0.0–40.0)

## 2024-02-16 MED ORDER — BUSPIRONE HCL 10 MG PO TABS: 10.0000 mg | ORAL_TABLET | Freq: Two times a day (BID) | ORAL | 3 refills | Status: DC

## 2024-02-16 MED ORDER — ALPRAZOLAM 0.25 MG PO TABS: 0.2500 mg | ORAL_TABLET | Freq: Two times a day (BID) | ORAL | 1 refills | Status: DC | PRN

## 2024-02-16 MED ORDER — CLOTRIMAZOLE-BETAMETHASONE 1-0.05 % EX CREA: 1.0000 | TOPICAL_CREAM | Freq: Every day | CUTANEOUS | 3 refills | Status: AC

## 2024-02-16 NOTE — Progress Notes (Signed)
   Subjective:    Patient ID: Lynn Bell, female    DOB: 1986-07-11, 38 y.o.   MRN: [id number]  HPI CPE- has pap scheduled, UTD on Tdap.  Patient Care Team    Relationship Specialty Notifications Start End  Mahlon Comer BRAVO, MD PCP - General Family Medicine  07/06/12   Kandyce Sor, MD Consulting Physician Obstetrics and Gynecology  08/30/15   Dannielle Bouchard, DO Consulting Physician Obstetrics and Gynecology  02/16/24     Health Maintenance  Topic Date Due   Hepatitis C Screening  Never done   Cervical Cancer Screening (HPV/Pap Cotest)  09/25/2019   COVID-19 Vaccine (1 - 2024-25 season) Never done   INFLUENZA VACCINE  04/14/2024   DTaP/Tdap/Td (2 - Td or Tdap) 08/31/2026   HIV Screening  Completed   HPV VACCINES  Aged Out   Meningococcal B Vaccine  Aged Out      Review of Systems Patient reports no vision/ hearing changes, adenopathy,fever, persistant/recurrent hoarseness , swallowing issues, chest pain, palpitations, edema, persistant/recurrent cough, hemoptysis, dyspnea (rest/exertional/paroxysmal nocturnal), gastrointestinal bleeding (melena, rectal bleeding), abdominal pain, significant heartburn, bowel changes, GU symptoms (dysuria, hematuria, incontinence), Gyn symptoms (abnormal  bleeding, pain),  syncope, focal weakness, memory loss, numbness & tingling, hair/nail changes, abnormal bruising or bleeding.   + 14 lb weight gain + anxiety- pt reports that in the last 6 months anxiety has increased and often manifests in irritability.  Has added CBD THC gummies w/o much improvement + skin changes under breast- dry, itchy, will have occasional sores    Objective:   Physical Exam General Appearance:    Alert, cooperative, no distress, appears stated age  Head:    Normocephalic, without obvious abnormality, atraumatic  Eyes:    PERRL, conjunctiva/corneas clear, EOM's intact both eyes  Ears:    Normal TM's and external ear canals, both ears  Nose:   Nares normal, septum  midline, mucosa normal, no drainage    or sinus tenderness  Throat:   Lips, mucosa, and tongue normal; teeth and gums normal  Neck:   Supple, symmetrical, trachea midline, R sided soft tissue mass;    Thyroid : no enlargement/tenderness/nodules  Back:     Symmetric, no curvature, ROM normal, no CVA tenderness  Lungs:     Clear to auscultation bilaterally, respirations unlabored  Chest Wall:    No tenderness or deformity   Heart:    Regular rate and rhythm, S1 and S2 normal, no murmur, rub   or gallop  Breast Exam:    Deferred to GYN  Abdomen:     Soft, non-tender, bowel sounds active all four quadrants,    no masses, no organomegaly  Genitalia:    Deferred to GYN  Rectal:    Extremities:   Extremities normal, atraumatic, no cyanosis or edema  Pulses:   2+ and symmetric all extremities  Skin:   Skin color, texture, turgor normal, no rashes or lesions  Lymph nodes:   Cervical, supraclavicular, and axillary nodes normal  Neurologic:   CNII-XII intact, normal strength, sensation and reflexes    throughout          Assessment & Plan:

## 2024-02-16 NOTE — Patient Instructions (Signed)
 Follow up in 6 weeks to recheck mood We'll notify you of your lab results and make any changes if needed INCREASE the Buspar  to 10mg  twice daily CONTINUE the Wellbutrin  150mg  daily USE the Clotrimazole /Betamethasone  cream as needed under breasts or on skin folds Dry cream w/ cool setting on hair dryer before putting on a bra Continue to work on healthy diet and regular exercise- you're doing great! Call with any questions or concerns Hang in there!!!

## 2024-02-17 LAB — HEPATITIS C ANTIBODY: Hepatitis C Ab: NONREACTIVE

## 2024-02-23 ENCOUNTER — Ambulatory Visit (HOSPITAL_BASED_OUTPATIENT_CLINIC_OR_DEPARTMENT_OTHER)
Admission: RE | Admit: 2024-02-23 | Discharge: 2024-02-23 | Disposition: A | Discharge status: Home / Self Care | Source: Ambulatory Visit | Attending: Family Medicine | Admitting: Family Medicine

## 2024-02-23 DIAGNOSIS — R221 Localized swelling, mass and lump, neck: Secondary | ICD-10-CM | POA: Diagnosis not present

## 2024-03-13 NOTE — Assessment & Plan Note (Signed)
 Pt's PE WNL w/ exception of R sided soft tissue mass in neck (US  ordered).  Pap scheduled.  UTD on Tdap.  Check labs.  Anticipatory guidance provided.

## 2024-03-13 NOTE — Assessment & Plan Note (Signed)
 Deteriorated.  Pt reports worsening anxiety in the last 6 months.  She finds herself increasingly irritable and unable to manage things that previously didn't bother her.  Has add CBD THC gummies w/o improvement.  Will increase Buspar  to 10mg  BID.  Continue Wellbutrin  and use Alprazolam  prn.

## 2024-03-17 ENCOUNTER — Other Ambulatory Visit: Payer: Self-pay | Admitting: Family Medicine

## 2024-03-27 ENCOUNTER — Ambulatory Visit: Admitting: Family Medicine

## 2024-04-16 ENCOUNTER — Encounter (HOSPITAL_BASED_OUTPATIENT_CLINIC_OR_DEPARTMENT_OTHER): Payer: Self-pay

## 2024-04-16 ENCOUNTER — Ambulatory Visit (HOSPITAL_BASED_OUTPATIENT_CLINIC_OR_DEPARTMENT_OTHER)
Admission: EM | Admit: 2024-04-16 | Discharge: 2024-04-16 | Disposition: A | Discharge status: Home / Self Care | Attending: Family Medicine | Admitting: Family Medicine

## 2024-04-16 ENCOUNTER — Ambulatory Visit (HOSPITAL_BASED_OUTPATIENT_CLINIC_OR_DEPARTMENT_OTHER): Admit: 2024-04-16 | Discharge: 2024-04-16 | Disposition: A | Discharge status: Home / Self Care | Admitting: Radiology

## 2024-04-16 DIAGNOSIS — S93401A Sprain of unspecified ligament of right ankle, initial encounter: Secondary | ICD-10-CM

## 2024-04-16 DIAGNOSIS — M25571 Pain in right ankle and joints of right foot: Secondary | ICD-10-CM

## 2024-04-16 DIAGNOSIS — M25471 Effusion, right ankle: Secondary | ICD-10-CM

## 2024-04-16 NOTE — ED Triage Notes (Signed)
 Right ankle pain after stepping wrong last night. States put son to bed, turned around, took a step, felt and heard a pop, and began having severe pain to right ankle. Patient was able to ambulate to treatment area unassisted. Declined wheelchair. + swelling to right ankle. Good pedal pulse. Took tylenol  x 2 at 0930.

## 2024-04-16 NOTE — Discharge Instructions (Addendum)
 There is no fracture of the ankle.  Just a sprain.  Recommend rest, ice, elevation and compression with Ace wrap.  Ace wrap applied here today.  You can take Tylenol  and ibuprofen  for pain as needed.  Follow-up with ortho if problem doesn't resolve.

## 2024-04-19 NOTE — ED Provider Notes (Signed)
 [facility]    CSN: [id number] Arrival date & time: 04/16/24  1320      History   Chief Complaint Chief Complaint  Patient presents with   Ankle Pain    HPI Lynn Bell is a 38 y.o. female.   38 year old female that presents with right ankle pain, swelling after stepping wrong last night. States put son to bed, turned around, took a step, felt and heard a pop, and began having severe pain to right ankle. Patient was able to ambulate to treatment area unassisted.    Ankle Pain   Past Medical History:  Diagnosis Date   Anxiety    Cardiac arrhythmia    Depression    PPD   History of chicken pox    Pregnancy induced hypertension    UTI (lower urinary tract infection)     Patient Active Problem List   Diagnosis Date Noted   Cervical lymphadenopathy 01/28/2023   Ear itch 11/23/2022   History of acute sinusitis 11/23/2022   Lumbar back pain with radiculopathy affecting left lower extremity 01/18/2022   Eczema of external ear 06/22/2021   Pars defect of lumbar spine 11/28/2020   SI (sacroiliac) joint dysfunction 07/11/2020   Nonallopathic lesion of sacral region 07/11/2020   Gestational hypertension 01/07/2020   Previous cesarean section 01/07/2020   Gout 10/07/2018   Birth control counseling 03/30/2016   Psoriasis 03/30/2016   Physical exam 08/30/2015   Family history of early CAD 01/09/2011   Eczema of hand 01/09/2011   Anxiety 01/09/2011    Past Surgical History:  Procedure Laterality Date   CESAREAN SECTION N/A 06/01/2017   Procedure: CESAREAN SECTION;  Surgeon: Curlene Agent, MD;  Location: [facility];  Service: Obstetrics;  Laterality: N/A;   CESAREAN SECTION N/A 01/07/2020   Procedure: CESAREAN SECTION;  Surgeon: Dannielle Bouchard, DO;  Location: [facility] LD ORS;  Service: Obstetrics;  Laterality: N/A;  REPEAT EDC 01/28/20 NKDA NEED RNFA   TONSILLECTOMY      OB History     Gravida  4   Para  2   Term  2   Preterm      AB  2    Living  2      SAB  2   IAB      Ectopic      Multiple  0   Live Births  2            Home Medications    Prior to Admission medications   Medication Sig Start Date End Date Taking? Authorizing Provider  ALPRAZolam  (XANAX ) 0.25 MG tablet Take 1 tablet (0.25 mg total) by mouth 2 (two) times daily as needed for anxiety. 02/16/24   Tabori, Katherine E, MD  buPROPion  (WELLBUTRIN  XL) 150 MG 24 hr tablet TAKE 1 TABLET BY MOUTH EVERY DAY 03/20/24   Tabori, Katherine E, MD  busPIRone  (BUSPAR ) 10 MG tablet Take 1 tablet (10 mg total) by mouth 2 (two) times daily. 02/16/24   Tabori, Katherine E, MD  clotrimazole -betamethasone  (LOTRISONE ) cream Apply 1 Application topically daily. 02/16/24   Tabori, Katherine E, MD  Fluocinolone  Acetonide 0.01 % OIL Place 5 drops in ear(s) 2 (two) times daily. Use for 7-14 days as needed 03/13/21   Tabori, Katherine E, MD  mometasone  (NASONEX ) 50 MCG/ACT nasal spray Place 2 sprays into the nose daily. 02/12/22   Tabori, Katherine E, MD  tranexamic acid (LYSTEDA) 650 MG TABS tablet Take 1,300 mg by mouth 3 (three) times  daily.    [provider]    Family History Family History  Problem Relation Age of Onset   Hyperlipidemia Mother    Heart disease Mother    Hypertension Mother    Pancreatic cancer Mother    Hyperlipidemia Father    Heart disease Father    Hypertension Father    Breast cancer Maternal Grandmother    Cancer Maternal Grandfather        kidney removal due to mass   Breast cancer Paternal Grandmother    Kidney disease Paternal Grandfather     Social History Social History   Tobacco Use   Smoking status: Former    Current packs/day: 0.25    Types: Cigarettes   Smokeless tobacco: Never  Vaping Use   Vaping status: Never Used  Substance Use Topics   Alcohol use: Yes    Comment: weekly   Drug use: No     Allergies   Latex   Review of Systems Review of Systems See HPI  Physical Exam Triage Vital Signs ED Triage  Vitals  Encounter Vitals Group     BP 04/16/24 1406 (!) 143/87     Girls Systolic BP Percentile --      Girls Diastolic BP Percentile --      Boys Systolic BP Percentile --      Boys Diastolic BP Percentile --      Pulse Rate 04/16/24 1406 82     Resp 04/16/24 1406 20     Temp 04/16/24 1406 98.4 F (36.9 C)     Temp Source 04/16/24 1406 Oral     SpO2 04/16/24 1406 99 %     Weight --      Height --      Head Circumference --      Peak Flow --      Pain Score 04/16/24 1408 7     Pain Loc --      Pain Education --      Exclude from Growth Chart --    No data found.  Updated Vital Signs BP (!) 143/87 (BP Location: Right Arm)   Pulse 82   Temp 98.4 F (36.9 C) (Oral)   Resp 20   LMP 04/10/2024   SpO2 99%   Visual Acuity Right Eye Distance:   Left Eye Distance:   Bilateral Distance:    Right Eye Near:   Left Eye Near:    Bilateral Near:     Physical Exam   UC Treatments / Results  Labs (all labs ordered are listed, but only abnormal results are displayed) Labs Reviewed - No data to display  EKG   Radiology No results found.  Procedures Procedures (including critical care time)  Medications Ordered in UC Medications - No data to display  Initial Impression / Assessment and Plan / UC Course  I have reviewed the triage vital signs and the nursing notes.  Pertinent labs & imaging results that were available during my care of the patient were reviewed by me and considered in my medical decision making (see chart for details).     Right ankle sprain- xray with no acute fracture.  RICE, tylenol  and ibuprofen  for the pain, swelling.  See ortho if problem persists.  Final Clinical Impressions(s) / UC Diagnoses   Final diagnoses:  Sprain of right ankle, unspecified ligament, initial encounter     Discharge Instructions      There is no fracture of the ankle.  Just a sprain.  Recommend rest, ice, elevation and compression with Ace wrap.  Ace wrap  applied here today.  You can take Tylenol  and ibuprofen  for pain as needed.  Follow-up with ortho if problem doesn't resolve.     ED Prescriptions   None    PDMP not reviewed this encounter.   Adah Wilbert LABOR, FNP 04/19/24 [phone]

## 2024-04-24 ENCOUNTER — Ambulatory Visit: Admitting: Family Medicine

## 2024-05-03 ENCOUNTER — Encounter: Payer: Self-pay | Admitting: Family Medicine

## 2024-05-03 ENCOUNTER — Ambulatory Visit (INDEPENDENT_AMBULATORY_CARE_PROVIDER_SITE_OTHER): Admitting: Family Medicine

## 2024-05-03 VITALS — BP 124/70 | HR 96 | Temp 98.2°F | Resp 18 | Ht 65.5 in | Wt 167.8 lb

## 2024-05-03 DIAGNOSIS — F32A Depression, unspecified: Secondary | ICD-10-CM

## 2024-05-03 DIAGNOSIS — F419 Anxiety disorder, unspecified: Secondary | ICD-10-CM

## 2024-05-03 MED ORDER — FLUOXETINE HCL 10 MG PO CAPS: 10.0000 mg | ORAL_CAPSULE | Freq: Every day | ORAL | 3 refills | Status: DC

## 2024-05-03 NOTE — Patient Instructions (Signed)
 Follow up in 4 weeks to recheck mood Start the Fluoxetine  once daily in addition to the Wellbutrin  and Buspar  Make sure you celebrate your birthday!!! Call with any questions or concerns Wenceslao in there!!!

## 2024-05-03 NOTE — Progress Notes (Unsigned)
   Subjective:    Patient ID: Lynn Bell, female    DOB: January 01, 1986, 38 y.o.   MRN: [id number]  HPI Anxiety- at last visit we increased Buspar  to 10mg  BID.  Also on Wellbutrin  150mg  daily and Alprazolam  prn.  Pt reports anxiety is constant and has increased depression.  Pt was previously on Prozac  and mood was great but this impacted sex drive.   Review of Systems For ROS see HPI     Objective:   Physical Exam        Assessment & Plan:

## 2024-05-04 NOTE — Assessment & Plan Note (Signed)
 Deteriorated.  No improvement w/ increased dose of Buspar .  Also on Wellbutrin  and Alprazolam  prn.  Depression is worse, anxiety is 'constant'.  Will restart Fluoxetine  as pt remembers having success on this years ago.  Continue to follow closely.

## 2024-05-31 ENCOUNTER — Encounter: Payer: Self-pay | Admitting: Family Medicine

## 2024-05-31 ENCOUNTER — Telehealth (INDEPENDENT_AMBULATORY_CARE_PROVIDER_SITE_OTHER): Admitting: Family Medicine

## 2024-05-31 ENCOUNTER — Ambulatory Visit: Admitting: Family Medicine

## 2024-05-31 DIAGNOSIS — F32A Depression, unspecified: Secondary | ICD-10-CM

## 2024-05-31 DIAGNOSIS — Z8759 Personal history of other complications of pregnancy, childbirth and the puerperium: Secondary | ICD-10-CM | POA: Insufficient documentation

## 2024-05-31 DIAGNOSIS — F419 Anxiety disorder, unspecified: Secondary | ICD-10-CM

## 2024-05-31 MED ORDER — FLUOXETINE HCL 20 MG PO CAPS: 20.0000 mg | ORAL_CAPSULE | Freq: Every day | ORAL | 3 refills | Status: DC

## 2024-05-31 NOTE — Progress Notes (Signed)
   Virtual Visit via Video   I connected with patient on 05/31/24 at  2:20 PM EDT by a video enabled telemedicine application and verified that I am speaking with the correct person using two identifiers.  Location patient: Home Location provider: [facility], Office Persons participating in the virtual visit: Patient, Provider, CMA (Tonya H)  I discussed the limitations of evaluation and management by telemedicine and the availability of in person appointments. The patient expressed understanding and agreed to proceed.  Subjective:   HPI:   Anxiety and Depression- ongoing issue.  Added the Fluoxetine  10mg  last visit and reports a notable difference.  Also on Wellbutrin  and Buspar .  Continues to struggle in the morning w/ racing thoughts and anxiety  ROS:   See pertinent positives and negatives per HPI.  Patient Active Problem List   Diagnosis Date Noted   History of gestational hypertension 05/31/2024   Cervical lymphadenopathy 01/28/2023   Ear itch 11/23/2022   History of acute sinusitis 11/23/2022   Lumbar back pain with radiculopathy affecting left lower extremity 01/18/2022   Eczema of external ear 06/22/2021   Pars defect of lumbar spine 11/28/2020   SI (sacroiliac) joint dysfunction 07/11/2020   Nonallopathic lesion of sacral region 07/11/2020   Gestational hypertension 01/07/2020   Previous cesarean section 01/07/2020   Gout 10/07/2018   Birth control counseling 03/30/2016   Psoriasis 03/30/2016   Physical exam 08/30/2015   Family history of early CAD 01/09/2011   Eczema of hand 01/09/2011   Anxiety and depression 01/09/2011    Social History   Tobacco Use   Smoking status: Former    Current packs/day: 0.25    Types: Cigarettes   Smokeless tobacco: Never  Substance Use Topics   Alcohol use: Yes    Comment: weekly    Current Outpatient Medications:    ALPRAZolam  (XANAX ) 0.25 MG tablet, Take 1 tablet (0.25 mg total) by mouth 2 (two) times daily as  needed for anxiety., Disp: 45 tablet, Rfl: 1   B Complex Vitamins (VITAMIN B COMPLEX PO), , Disp: , Rfl:    buPROPion  (WELLBUTRIN  XL) 150 MG 24 hr tablet, TAKE 1 TABLET BY MOUTH EVERY DAY, Disp: 90 tablet, Rfl: 1   busPIRone  (BUSPAR ) 10 MG tablet, Take 1 tablet (10 mg total) by mouth 2 (two) times daily., Disp: 60 tablet, Rfl: 3   clotrimazole -betamethasone  (LOTRISONE ) cream, Apply 1 Application topically daily., Disp: 30 g, Rfl: 3   FLUoxetine  (PROZAC ) 10 MG capsule, Take 1 capsule (10 mg total) by mouth daily., Disp: 30 capsule, Rfl: 3   mometasone  (NASONEX ) 50 MCG/ACT nasal spray, Place 2 sprays into the nose daily., Disp: 1 each, Rfl: 12  Allergies  Allergen Reactions   Latex     Pt prefers no latex, but not an allergy Sensitive skin    Objective:   There were no vitals taken for this visit. AAOx3, NAD NCAT, EOMI No obvious CN deficits Coloring WNL Pt is able to speak clearly, coherently without shortness of breath or increased work of breathing.  Thought process is linear.  Mood is appropriate.   Assessment and Plan:   Anxiety/Depression- improving.  Pt reports depression is improving- energy and motivation are also improve- but she continues to struggle w/ anxiety.  Will increase Fluoxetine  to 20mg  daily and monitor.  Pt expressed understanding and is in agreement w/ plan.    Comer Greet, MD 05/31/2024

## 2024-06-30 ENCOUNTER — Telehealth: Payer: Self-pay | Admitting: Family Medicine

## 2024-06-30 MED ORDER — BUSPIRONE HCL 10 MG PO TABS: 10.0000 mg | ORAL_TABLET | Freq: Two times a day (BID) | ORAL | 3 refills | Status: AC

## 2024-06-30 NOTE — Telephone Encounter (Signed)
 Called patient and let her know that we are sending her medication. We never rcvd a med refill. Patient may or may not need to increase her medication as her father recently passed away    Copied from CRM [id number]. Topic: Clinical - Prescription Issue >> Jun 30, 2024 11:21 AM Lynn Bell wrote: Reason for CRM: Pt called in stating that she attempted to pick up busPIRone  (BUSPAR ) 10 MG tablet from the pharmacy and was told she is unable to pick up. When asked why pt stated that the pharmacy told her it was denied by the provider and she will need to call clinic to confirm issue. Per pt chart, pt had an appointment on Aug 20th to discuss medication and was to follow up in 4 weeks. Pt had an appointment scheduled for Sept 17th but it looks like appointment was cancelled then rescheduled and completed as telehealth. Contacted [facility] to get further assistance and was told they would have to look into this more and pt should expect a callback today. Informed pt with info.

## 2024-06-30 NOTE — Telephone Encounter (Signed)
 Patient called to say that she had requested a refill on her Buspar  and went to the pharmacy to pick up, pharmacist told her the script was denied and the provider would need to speak with her. Jacquline and I took a look and on 8/20 she was advised to f/u in 4 weeks around 9/17. Her appointment desk shows an appointment was scheduled and canceled for 9/17, and then another appointment that was completed on 9/17 by VV. It does not appear that any f/u notes were listed as far as appointments. Patient has CPE scheduled in June 2026. I advised [email or web address] agent that we would need to look into this a bit more and would be in touch with her as soon as possible with an update. I also labeled this high priority so that we can get it straightened out before the weekend.    busPIRone  (BUSPAR ) 10 MG tablet   [id number] - [place], [place] - [address]

## 2024-09-23 ENCOUNTER — Other Ambulatory Visit: Payer: Self-pay | Admitting: Family Medicine

## 2024-10-11 ENCOUNTER — Other Ambulatory Visit: Payer: Self-pay | Admitting: Family Medicine

## 2024-10-11 NOTE — Telephone Encounter (Signed)
 Requested Prescriptions   Pending Prescriptions Disp Refills   ALPRAZolam  (XANAX ) 0.25 MG tablet [Pharmacy Med Name: ALPRAZOLAM  0.25 MG TABLET] 45 tablet     Sig: TAKE 1 TABLET BY MOUTH 2 TIMES DAILY AS NEEDED FOR ANXIETY.     Date of patient request: 10/11/2024 Last office visit: 05/03/2024 Upcoming visit: 02/16/2025 Date of last refill: 02/16/2024 Last refill amount: 45

## 2025-02-16 ENCOUNTER — Encounter: Admitting: Family Medicine
